# Patient Record
Sex: Female | Born: 1980 | Race: White | Hispanic: No | Marital: Married | Smoking: Never smoker
Health system: Southern US, Community
[De-identification: ages and names within clinical notes are randomized; demographics above are authoritative.]

## PROBLEM LIST (undated history)

## (undated) DIAGNOSIS — K219 Gastro-esophageal reflux disease without esophagitis: Secondary | ICD-10-CM

## (undated) DIAGNOSIS — Z9889 Other specified postprocedural states: Secondary | ICD-10-CM

## (undated) DIAGNOSIS — R112 Nausea with vomiting, unspecified: Secondary | ICD-10-CM

## (undated) DIAGNOSIS — IMO0001 Reserved for inherently not codable concepts without codable children: Secondary | ICD-10-CM

## (undated) DIAGNOSIS — K589 Irritable bowel syndrome without diarrhea: Secondary | ICD-10-CM

## (undated) DIAGNOSIS — I10 Essential (primary) hypertension: Secondary | ICD-10-CM

## (undated) HISTORY — DX: Gastro-esophageal reflux disease without esophagitis: K21.9

## (undated) HISTORY — DX: Reserved for inherently not codable concepts without codable children: IMO0001

## (undated) HISTORY — DX: Irritable bowel syndrome without diarrhea: K58.9

---
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
## 2000-05-28 ENCOUNTER — Other Ambulatory Visit: Admission: RE | Admit: 2000-05-28 | Discharge: 2000-05-28 | Payer: Self-pay | Admitting: *Deleted

## 2001-05-31 ENCOUNTER — Other Ambulatory Visit: Admission: RE | Admit: 2001-05-31 | Discharge: 2001-05-31 | Payer: Self-pay | Admitting: *Deleted

## 2002-10-14 ENCOUNTER — Other Ambulatory Visit: Admission: RE | Admit: 2002-10-14 | Discharge: 2002-10-14 | Payer: Self-pay | Admitting: Obstetrics and Gynecology

## 2003-03-23 ENCOUNTER — Other Ambulatory Visit: Admission: RE | Admit: 2003-03-23 | Discharge: 2003-03-23 | Payer: Self-pay | Admitting: Obstetrics and Gynecology

## 2005-03-06 ENCOUNTER — Ambulatory Visit: Payer: Self-pay | Admitting: Internal Medicine

## 2011-01-27 LAB — RUBELLA ANTIBODY, IGM: Rubella: IMMUNE

## 2011-01-27 LAB — HEPATITIS B SURFACE ANTIGEN: Hepatitis B Surface Ag: NEGATIVE

## 2011-01-27 LAB — GC/CHLAMYDIA PROBE AMP, GENITAL: Chlamydia: NEGATIVE

## 2011-01-27 LAB — ABO/RH: RH Type: POSITIVE

## 2011-01-27 LAB — ANTIBODY SCREEN: Antibody Screen: NEGATIVE

## 2011-07-21 LAB — STREP B DNA PROBE: GBS: POSITIVE

## 2011-08-17 ENCOUNTER — Inpatient Hospital Stay (HOSPITAL_COMMUNITY): Admission: AD | Admit: 2011-08-17 | Payer: Self-pay | Source: Ambulatory Visit | Admitting: Obstetrics and Gynecology

## 2011-08-20 ENCOUNTER — Telehealth (HOSPITAL_COMMUNITY): Payer: Self-pay | Admitting: *Deleted

## 2011-08-20 ENCOUNTER — Encounter (HOSPITAL_COMMUNITY): Payer: Self-pay | Admitting: *Deleted

## 2011-08-20 NOTE — Telephone Encounter (Signed)
Preadmission screen  

## 2011-08-21 ENCOUNTER — Encounter (HOSPITAL_COMMUNITY): Payer: Self-pay | Admitting: *Deleted

## 2011-08-22 ENCOUNTER — Inpatient Hospital Stay (HOSPITAL_COMMUNITY)
Admission: RE | Admit: 2011-08-22 | Discharge: 2011-08-26 | DRG: 371 | Disposition: A | Discharge status: Home / Self Care | Payer: BC Managed Care – PPO | Source: Ambulatory Visit | Attending: Obstetrics and Gynecology | Admitting: Obstetrics and Gynecology

## 2011-08-22 ENCOUNTER — Inpatient Hospital Stay (HOSPITAL_COMMUNITY): Payer: BC Managed Care – PPO | Admitting: Anesthesiology

## 2011-08-22 ENCOUNTER — Encounter (HOSPITAL_COMMUNITY): Payer: Self-pay | Admitting: Anesthesiology

## 2011-08-22 ENCOUNTER — Encounter (HOSPITAL_COMMUNITY): Payer: Self-pay

## 2011-08-22 DIAGNOSIS — O9279 Other disorders of lactation: Secondary | ICD-10-CM | POA: Diagnosis not present

## 2011-08-22 DIAGNOSIS — O480 Post-term pregnancy: Principal | ICD-10-CM | POA: Diagnosis present

## 2011-08-22 DIAGNOSIS — Z22330 Carrier of Group B streptococcus: Secondary | ICD-10-CM

## 2011-08-22 DIAGNOSIS — O99892 Other specified diseases and conditions complicating childbirth: Secondary | ICD-10-CM | POA: Diagnosis present

## 2011-08-22 DIAGNOSIS — O324XX0 Maternal care for high head at term, not applicable or unspecified: Secondary | ICD-10-CM | POA: Diagnosis present

## 2011-08-22 DIAGNOSIS — O328XX0 Maternal care for other malpresentation of fetus, not applicable or unspecified: Secondary | ICD-10-CM | POA: Diagnosis present

## 2011-08-22 LAB — CBC
HCT: 36.6 % (ref 36.0–46.0)
Hemoglobin: 12.4 g/dL (ref 12.0–15.0)
MCH: 32.8 pg (ref 26.0–34.0)
MCHC: 33.9 g/dL (ref 30.0–36.0)
RBC: 3.78 MIL/uL — ABNORMAL LOW (ref 3.87–5.11)

## 2011-08-22 LAB — RPR: RPR Ser Ql: NONREACTIVE

## 2011-08-22 MED ORDER — CITRIC ACID-SODIUM CITRATE 334-500 MG/5ML PO SOLN
30.0000 mL | ORAL | Status: DC | PRN
  Administered 2011-08-23: 30 mL via ORAL
  Filled 2011-08-22: qty 15

## 2011-08-22 MED ORDER — FENTANYL 2.5 MCG/ML BUPIVACAINE 1/10 % EPIDURAL INFUSION (WH - ANES)
INTRAMUSCULAR | Status: DC | PRN
  Administered 2011-08-22: 13 mL/h via EPIDURAL

## 2011-08-22 MED ORDER — LIDOCAINE HCL (PF) 1 % IJ SOLN
30.0000 mL | INTRAMUSCULAR | Status: DC | PRN
  Filled 2011-08-22 (×2): qty 30

## 2011-08-22 MED ORDER — OXYTOCIN 20 UNITS IN LACTATED RINGERS INFUSION - SIMPLE: 125.0000 mL/h | Freq: Once | INTRAVENOUS | Status: DC

## 2011-08-22 MED ORDER — PHENYLEPHRINE 40 MCG/ML (10ML) SYRINGE FOR IV PUSH (FOR BLOOD PRESSURE SUPPORT)
80.0000 ug | PREFILLED_SYRINGE | INTRAVENOUS | Status: DC | PRN
  Filled 2011-08-22 (×2): qty 5

## 2011-08-22 MED ORDER — LACTATED RINGERS IV SOLN
INTRAVENOUS | Status: DC
  Administered 2011-08-22 – 2011-08-23 (×6): via INTRAVENOUS

## 2011-08-22 MED ORDER — TERBUTALINE SULFATE 1 MG/ML IJ SOLN: 0.2500 mg | Freq: Once | INTRAMUSCULAR | Status: AC | PRN

## 2011-08-22 MED ORDER — BUTORPHANOL TARTRATE 2 MG/ML IJ SOLN: 1.0000 mg | INTRAMUSCULAR | Status: DC | PRN

## 2011-08-22 MED ORDER — OXYTOCIN 20 UNITS IN LACTATED RINGERS INFUSION - SIMPLE
1.0000 m[IU]/min | INTRAVENOUS | Status: DC
  Administered 2011-08-22: 2 m[IU]/min via INTRAVENOUS
  Filled 2011-08-22: qty 1000

## 2011-08-22 MED ORDER — LIDOCAINE HCL 1.5 % IJ SOLN
INTRAMUSCULAR | Status: DC | PRN
  Administered 2011-08-22 (×2): 5 mL via EPIDURAL

## 2011-08-22 MED ORDER — FENTANYL 2.5 MCG/ML BUPIVACAINE 1/10 % EPIDURAL INFUSION (WH - ANES)
14.0000 mL/h | INTRAMUSCULAR | Status: DC
  Administered 2011-08-22 (×2): 14 mL/h via EPIDURAL
  Filled 2011-08-22 (×4): qty 60

## 2011-08-22 MED ORDER — IBUPROFEN 600 MG PO TABS: 600.0000 mg | ORAL_TABLET | Freq: Four times a day (QID) | ORAL | Status: DC | PRN

## 2011-08-22 MED ORDER — PENICILLIN G POTASSIUM 5000000 UNITS IJ SOLR
2.5000 10*6.[IU] | INTRAVENOUS | Status: DC
  Administered 2011-08-22 – 2011-08-23 (×4): 2.5 10*6.[IU] via INTRAVENOUS
  Filled 2011-08-22 (×8): qty 2.5

## 2011-08-22 MED ORDER — PENICILLIN G POTASSIUM 5000000 UNITS IJ SOLR
5.0000 10*6.[IU] | Freq: Once | INTRAVENOUS | Status: AC
  Administered 2011-08-22: 5 10*6.[IU] via INTRAVENOUS
  Filled 2011-08-22: qty 5

## 2011-08-22 MED ORDER — PHENYLEPHRINE 40 MCG/ML (10ML) SYRINGE FOR IV PUSH (FOR BLOOD PRESSURE SUPPORT)
80.0000 ug | PREFILLED_SYRINGE | INTRAVENOUS | Status: DC | PRN
  Filled 2011-08-22: qty 5

## 2011-08-22 MED ORDER — FLEET ENEMA 7-19 GM/118ML RE ENEM: 1.0000 | ENEMA | RECTAL | Status: DC | PRN

## 2011-08-22 MED ORDER — EPHEDRINE 5 MG/ML INJ
10.0000 mg | INTRAVENOUS | Status: DC | PRN
  Filled 2011-08-22: qty 4

## 2011-08-22 MED ORDER — LACTATED RINGERS IV SOLN
500.0000 mL | Freq: Once | INTRAVENOUS | Status: AC
  Administered 2011-08-22: 500 mL via INTRAVENOUS

## 2011-08-22 MED ORDER — ONDANSETRON HCL 4 MG/2ML IJ SOLN: 4.0000 mg | Freq: Four times a day (QID) | INTRAMUSCULAR | Status: DC | PRN

## 2011-08-22 MED ORDER — OXYTOCIN BOLUS FROM INFUSION
500.0000 mL | Freq: Once | INTRAVENOUS | Status: DC
  Filled 2011-08-22: qty 500

## 2011-08-22 MED ORDER — LACTATED RINGERS IV SOLN
500.0000 mL | INTRAVENOUS | Status: DC | PRN
  Administered 2011-08-22: 500 mL via INTRAVENOUS

## 2011-08-22 MED ORDER — ACETAMINOPHEN 325 MG PO TABS: 650.0000 mg | ORAL_TABLET | ORAL | Status: DC | PRN

## 2011-08-22 MED ORDER — DIPHENHYDRAMINE HCL 50 MG/ML IJ SOLN: 12.5000 mg | INTRAMUSCULAR | Status: DC | PRN

## 2011-08-22 MED ORDER — OXYCODONE-ACETAMINOPHEN 5-325 MG PO TABS: 2.0000 | ORAL_TABLET | ORAL | Status: DC | PRN

## 2011-08-22 MED ORDER — EPHEDRINE 5 MG/ML INJ
10.0000 mg | INTRAVENOUS | Status: DC | PRN
  Filled 2011-08-22 (×2): qty 4

## 2011-08-22 NOTE — Progress Notes (Signed)
  Labor PN  Comfortable after epidural   Filed Vitals:   08/22/11 1601 08/22/11 1629 08/22/11 1631 08/22/11 1701  BP: 117/83  118/73 113/69  Pulse: 81  82 76  Temp:  97.8 F (36.6 C)    TempSrc:  Oral    Resp: 18 18  18   Height:      Weight:      SpO2:       Dilation: 3 Effacement (%): 90 Cervical Position: Middle Station: 0 Presentation: Vertex Exam by:: dr Tenny Craw FHT 140 +accels, no decels, reactive Toco: q2-3  A/P Cont pit fwb reassuring

## 2011-08-22 NOTE — Anesthesia Procedure Notes (Signed)
Epidural Patient location during procedure: OB Start time: 08/22/2011 2:53 PM End time: 08/22/2011 2:58 PM Reason for block: procedure for pain  Staffing Anesthesiologist: Sandrea Hughs Performed by: anesthesiologist   Preanesthetic Checklist Completed: patient identified, site marked, surgical consent, pre-op evaluation, timeout performed, IV checked, risks and benefits discussed and monitors and equipment checked  Epidural Patient position: sitting Prep: site prepped and draped and DuraPrep Patient monitoring: continuous pulse ox and blood pressure Approach: midline Injection technique: LOR air  Needle:  Needle type: Tuohy  Needle gauge: 17 G Needle length: 9 cm Needle insertion depth: 5 cm cm Catheter type: closed end flexible Catheter size: 19 Gauge Catheter at skin depth: 10 cm Test dose: negative and 1.5% lidocaine  Assessment Sensory level: T8 Events: blood not aspirated, injection not painful, no injection resistance, negative IV test and no paresthesia

## 2011-08-22 NOTE — Anesthesia Preprocedure Evaluation (Signed)
Anesthesia Evaluation  Patient identified by MRN, date of birth, ID band Patient awake  General Assessment Comment  Reviewed: Allergy & Precautions, H&P , NPO status , Patient's Chart, lab work & pertinent test results  Airway Mallampati: I TM Distance: >3 FB Neck ROM: full    Dental No notable dental hx.    Pulmonary    Pulmonary exam normal       Cardiovascular     Neuro/Psych Negative Neurological ROS  Negative Psych ROS   GI/Hepatic negative GI ROS Neg liver ROS    Endo/Other  Negative Endocrine ROS  Renal/GU negative Renal ROS  Genitourinary negative   Musculoskeletal negative musculoskeletal ROS (+)   Abdominal Normal abdominal exam  (+)   Peds negative pediatric ROS (+)  Hematology negative hematology ROS (+)   Anesthesia Other Findings   Reproductive/Obstetrics (+) Pregnancy                           Anesthesia Physical Anesthesia Plan  ASA: II  Anesthesia Plan: Epidural   Post-op Pain Management:    Induction:   Airway Management Planned:   Additional Equipment:   Intra-op Plan:   Post-operative Plan:   Informed Consent: I have reviewed the patients History and Physical, chart, labs and discussed the procedure including the risks, benefits and alternatives for the proposed anesthesia with the patient or authorized representative who has indicated his/her understanding and acceptance.     Plan Discussed with:   Anesthesia Plan Comments:         Anesthesia Quick Evaluation  

## 2011-08-22 NOTE — H&P (Signed)
Anna Brady is a 30 y.o. female G1P0 presenting @ 40+5 for postdates induction of labor Pregnancy has been uncomplicated History OB History    Grav Para Term Preterm Abortions TAB SAB Ect Mult Living   1 0 0 0 0 0 0 0 0 0      Past Medical History  Diagnosis Date  . IBS (irritable bowel syndrome)   . Reflux   . IBS (irritable bowel syndrome)    History reviewed. No pertinent past surgical history. Family History: family history includes Cancer in her maternal grandmother, mother, paternal aunt, and paternal grandfather; Diabetes in her father, maternal grandfather, maternal grandmother, maternal uncle, and paternal aunt; Heart disease in her father, maternal grandfather, and paternal uncle; and Hypertension in her cousin, maternal aunt, maternal grandfather, maternal grandmother, maternal uncle, and mother.  There is no history of Anesthesia problems, and Hypotension, and Malignant hyperthermia, and Pseudochol deficiency, . Social History:  reports that she has never smoked. She has never used smokeless tobacco. She reports that she does not drink alcohol or use illicit drugs.  ROS    Blood pressure 122/87, pulse 91, resp. rate 20, height 5\' 7"  (1.702 m), weight 69.854 kg (154 lb), last menstrual period 11/10/2010. Exam Physical Exam  AOx3, NAD Gravid, soft, nt cvx 3/80/-2 Fht: 140-150 + accels, reactive, no decels CVX: 3/80/-2  Prenatal labs: ABO, Rh: A/Positive/-- (04/02 0000) Antibody: Negative (04/02 0000) Rubella: Immune (04/02 0000) RPR: Nonreactive (04/02 0000)  HBsAg: Negative (04/02 0000)  HIV: Non-reactive (04/02 0000)  GBS: Positive (09/24 0000)   Assessment/Plan: Admit PCN for GBS Pitocin for augmentation Epidural on request AROM after 2nd dose PCN  [name] H. 08/22/2011, 9:27 AM

## 2011-08-22 NOTE — Progress Notes (Signed)
  Labor PN  Uncomfortable with contractions Filed Vitals:   08/22/11 1234 08/22/11 1304 08/22/11 1333 08/22/11 1405  BP: 111/65 112/68 113/70 114/70  Pulse: 85 85 88 99  Temp:      TempSrc:      Resp:  18 17 18   Height:      Weight:       fht 140-150 reactive with aceels no decels cvx unchanged from previous exam toco Q1-2  A/P Place epidural arom after epidural fwb reassuring

## 2011-08-23 ENCOUNTER — Encounter (HOSPITAL_COMMUNITY): Payer: Self-pay

## 2011-08-23 ENCOUNTER — Encounter (HOSPITAL_COMMUNITY): Payer: Self-pay | Admitting: Anesthesiology

## 2011-08-23 ENCOUNTER — Encounter (HOSPITAL_COMMUNITY)
Admission: RE | Disposition: A | Discharge status: Home / Self Care | Payer: Self-pay | Source: Ambulatory Visit | Attending: Obstetrics and Gynecology

## 2011-08-23 SURGERY — Surgical Case
Anesthesia: Epidural | Site: Abdomen | Wound class: Clean Contaminated

## 2011-08-23 MED ORDER — SODIUM BICARBONATE 8.4 % IV SOLN
INTRAVENOUS | Status: DC | PRN
  Administered 2011-08-23: 5 mL via EPIDURAL

## 2011-08-23 MED ORDER — LANOLIN HYDROUS EX OINT: 1.0000 "application " | TOPICAL_OINTMENT | CUTANEOUS | Status: DC | PRN

## 2011-08-23 MED ORDER — IBUPROFEN 600 MG PO TABS
600.0000 mg | ORAL_TABLET | Freq: Four times a day (QID) | ORAL | Status: DC | PRN
  Filled 2011-08-23 (×8): qty 1

## 2011-08-23 MED ORDER — DIPHENHYDRAMINE HCL 50 MG/ML IJ SOLN: 12.5000 mg | INTRAMUSCULAR | Status: DC | PRN

## 2011-08-23 MED ORDER — CEFAZOLIN SODIUM 1-5 GM-% IV SOLN
INTRAVENOUS | Status: AC
  Filled 2011-08-23: qty 50

## 2011-08-23 MED ORDER — ZOLPIDEM TARTRATE 5 MG PO TABS: 5.0000 mg | ORAL_TABLET | Freq: Every evening | ORAL | Status: DC | PRN

## 2011-08-23 MED ORDER — MEPERIDINE HCL 25 MG/ML IJ SOLN
INTRAMUSCULAR | Status: AC
  Filled 2011-08-23: qty 1

## 2011-08-23 MED ORDER — PHENYLEPHRINE HCL 10 MG/ML IJ SOLN
INTRAMUSCULAR | Status: DC | PRN
  Administered 2011-08-23: 80 ug via INTRAVENOUS
  Administered 2011-08-23: 120 ug via INTRAVENOUS

## 2011-08-23 MED ORDER — DIPHENHYDRAMINE HCL 25 MG PO CAPS: 25.0000 mg | ORAL_CAPSULE | ORAL | Status: DC | PRN

## 2011-08-23 MED ORDER — MEPERIDINE HCL 25 MG/ML IJ SOLN
INTRAMUSCULAR | Status: DC | PRN
  Administered 2011-08-23: 7 mg via INTRAVENOUS
  Administered 2011-08-23 (×3): 6 mg via INTRAVENOUS

## 2011-08-23 MED ORDER — DIPHENHYDRAMINE HCL 25 MG PO CAPS: 25.0000 mg | ORAL_CAPSULE | Freq: Four times a day (QID) | ORAL | Status: DC | PRN

## 2011-08-23 MED ORDER — DIPHENHYDRAMINE HCL 50 MG/ML IJ SOLN: 25.0000 mg | INTRAMUSCULAR | Status: DC | PRN

## 2011-08-23 MED ORDER — LIDOCAINE-EPINEPHRINE (PF) 2 %-1:200000 IJ SOLN
INTRAMUSCULAR | Status: AC
  Filled 2011-08-23: qty 20

## 2011-08-23 MED ORDER — ONDANSETRON HCL 4 MG/2ML IJ SOLN: 4.0000 mg | INTRAMUSCULAR | Status: DC | PRN

## 2011-08-23 MED ORDER — KETOROLAC TROMETHAMINE 30 MG/ML IJ SOLN
30.0000 mg | Freq: Four times a day (QID) | INTRAMUSCULAR | Status: AC | PRN
  Administered 2011-08-23: 30 mg via INTRAVENOUS
  Filled 2011-08-23: qty 1

## 2011-08-23 MED ORDER — OXYTOCIN 20 UNITS IN LACTATED RINGERS INFUSION - SIMPLE: 125.0000 mL/h | INTRAVENOUS | Status: AC

## 2011-08-23 MED ORDER — ONDANSETRON HCL 4 MG/2ML IJ SOLN
INTRAMUSCULAR | Status: AC
  Filled 2011-08-23: qty 2

## 2011-08-23 MED ORDER — ONDANSETRON HCL 4 MG/2ML IJ SOLN: 4.0000 mg | Freq: Three times a day (TID) | INTRAMUSCULAR | Status: DC | PRN

## 2011-08-23 MED ORDER — NALBUPHINE HCL 10 MG/ML IJ SOLN
5.0000 mg | INTRAMUSCULAR | Status: DC | PRN
  Filled 2011-08-23: qty 1

## 2011-08-23 MED ORDER — KETOROLAC TROMETHAMINE 60 MG/2ML IM SOLN
INTRAMUSCULAR | Status: AC
  Administered 2011-08-23: 60 mg via INTRAMUSCULAR
  Filled 2011-08-23: qty 2

## 2011-08-23 MED ORDER — ONDANSETRON HCL 4 MG PO TABS: 4.0000 mg | ORAL_TABLET | ORAL | Status: DC | PRN

## 2011-08-23 MED ORDER — NALOXONE HCL 0.4 MG/ML IJ SOLN: 0.4000 mg | INTRAMUSCULAR | Status: DC | PRN

## 2011-08-23 MED ORDER — CEFAZOLIN SODIUM 1-5 GM-% IV SOLN
INTRAVENOUS | Status: DC | PRN
  Administered 2011-08-23: 1 g via INTRAVENOUS

## 2011-08-23 MED ORDER — OXYTOCIN 20 UNITS IN LACTATED RINGERS INFUSION - SIMPLE
INTRAVENOUS | Status: DC | PRN
  Administered 2011-08-23 (×2): 20 [IU] via INTRAVENOUS

## 2011-08-23 MED ORDER — PANTOPRAZOLE SODIUM 40 MG PO TBEC
40.0000 mg | DELAYED_RELEASE_TABLET | Freq: Every day | ORAL | Status: DC
  Administered 2011-08-23 – 2011-08-26 (×4): 40 mg via ORAL
  Filled 2011-08-23 (×5): qty 1

## 2011-08-23 MED ORDER — OXYTOCIN 10 UNIT/ML IJ SOLN
INTRAMUSCULAR | Status: AC
  Filled 2011-08-23: qty 4

## 2011-08-23 MED ORDER — MENTHOL 3 MG MT LOZG: 1.0000 | LOZENGE | OROMUCOSAL | Status: DC | PRN

## 2011-08-23 MED ORDER — MORPHINE SULFATE (PF) 0.5 MG/ML IJ SOLN
INTRAMUSCULAR | Status: DC | PRN
  Administered 2011-08-23: 1 mg via INTRAVENOUS

## 2011-08-23 MED ORDER — SODIUM BICARBONATE 8.4 % IV SOLN
INTRAVENOUS | Status: AC
  Filled 2011-08-23: qty 50

## 2011-08-23 MED ORDER — LACTATED RINGERS IV SOLN
INTRAVENOUS | Status: DC
  Administered 2011-08-23: 12:00:00 via INTRAVENOUS

## 2011-08-23 MED ORDER — TETANUS-DIPHTH-ACELL PERTUSSIS 5-2.5-18.5 LF-MCG/0.5 IM SUSP
0.5000 mL | Freq: Once | INTRAMUSCULAR | Status: AC
Start: 2011-08-24 — End: 2011-08-24
  Administered 2011-08-24: 0.5 mL via INTRAMUSCULAR

## 2011-08-23 MED ORDER — SODIUM CHLORIDE 0.9 % IV SOLN
1.0000 ug/kg/h | INTRAVENOUS | Status: DC | PRN
  Filled 2011-08-23: qty 2.5

## 2011-08-23 MED ORDER — MORPHINE SULFATE 0.5 MG/ML IJ SOLN
INTRAMUSCULAR | Status: AC
  Filled 2011-08-23: qty 10

## 2011-08-23 MED ORDER — ONDANSETRON HCL 4 MG/2ML IJ SOLN
INTRAMUSCULAR | Status: DC | PRN
  Administered 2011-08-23: 4 mg via INTRAVENOUS

## 2011-08-23 MED ORDER — METHYLERGONOVINE MALEATE 0.2 MG/ML IJ SOLN: 0.2000 mg | INTRAMUSCULAR | Status: DC | PRN

## 2011-08-23 MED ORDER — SODIUM CHLORIDE 0.9 % IR SOLN
Status: DC | PRN
  Administered 2011-08-23: 1

## 2011-08-23 MED ORDER — SODIUM CHLORIDE 0.9 % IJ SOLN: 3.0000 mL | INTRAMUSCULAR | Status: DC | PRN

## 2011-08-23 MED ORDER — KETOROLAC TROMETHAMINE 30 MG/ML IJ SOLN: 30.0000 mg | Freq: Four times a day (QID) | INTRAMUSCULAR | Status: AC | PRN

## 2011-08-23 MED ORDER — MORPHINE SULFATE (PF) 0.5 MG/ML IJ SOLN
INTRAMUSCULAR | Status: DC | PRN
  Administered 2011-08-23: 4 mg via EPIDURAL

## 2011-08-23 MED ORDER — SCOPOLAMINE 1 MG/3DAYS TD PT72
1.0000 | MEDICATED_PATCH | Freq: Once | TRANSDERMAL | Status: DC
  Filled 2011-08-23: qty 1

## 2011-08-23 MED ORDER — EPHEDRINE 5 MG/ML INJ
INTRAVENOUS | Status: AC
  Filled 2011-08-23: qty 10

## 2011-08-23 MED ORDER — IBUPROFEN 600 MG PO TABS
600.0000 mg | ORAL_TABLET | Freq: Four times a day (QID) | ORAL | Status: DC
  Administered 2011-08-23 – 2011-08-26 (×12): 600 mg via ORAL
  Filled 2011-08-23 (×4): qty 1

## 2011-08-23 MED ORDER — SENNOSIDES-DOCUSATE SODIUM 8.6-50 MG PO TABS
2.0000 | ORAL_TABLET | Freq: Every day | ORAL | Status: DC
  Administered 2011-08-23 – 2011-08-25 (×3): 2 via ORAL

## 2011-08-23 MED ORDER — LACTATED RINGERS IV SOLN
INTRAVENOUS | Status: DC | PRN
  Administered 2011-08-23: 04:00:00 via INTRAVENOUS

## 2011-08-23 MED ORDER — METHYLERGONOVINE MALEATE 0.2 MG PO TABS: 0.2000 mg | ORAL_TABLET | ORAL | Status: DC | PRN

## 2011-08-23 MED ORDER — PRENATAL PLUS 27-1 MG PO TABS
1.0000 | ORAL_TABLET | Freq: Every day | ORAL | Status: DC
  Administered 2011-08-23 – 2011-08-26 (×4): 1 via ORAL
  Filled 2011-08-23 (×4): qty 1

## 2011-08-23 MED ORDER — WITCH HAZEL-GLYCERIN EX PADS: 1.0000 "application " | MEDICATED_PAD | CUTANEOUS | Status: DC | PRN

## 2011-08-23 MED ORDER — DIBUCAINE 1 % RE OINT: 1.0000 "application " | TOPICAL_OINTMENT | RECTAL | Status: DC | PRN

## 2011-08-23 MED ORDER — MEPERIDINE HCL 25 MG/ML IJ SOLN: 6.2500 mg | INTRAMUSCULAR | Status: DC | PRN

## 2011-08-23 MED ORDER — SIMETHICONE 80 MG PO CHEW: 80.0000 mg | CHEWABLE_TABLET | ORAL | Status: DC | PRN

## 2011-08-23 MED ORDER — PHENYLEPHRINE 40 MCG/ML (10ML) SYRINGE FOR IV PUSH (FOR BLOOD PRESSURE SUPPORT)
PREFILLED_SYRINGE | INTRAVENOUS | Status: AC
  Filled 2011-08-23: qty 5

## 2011-08-23 MED ORDER — EPHEDRINE SULFATE 50 MG/ML IJ SOLN
INTRAMUSCULAR | Status: DC | PRN
  Administered 2011-08-23: 10 mg via INTRAVENOUS

## 2011-08-23 MED ORDER — OXYCODONE-ACETAMINOPHEN 5-325 MG PO TABS
1.0000 | ORAL_TABLET | ORAL | Status: DC | PRN
  Administered 2011-08-23 – 2011-08-26 (×12): 1 via ORAL
  Filled 2011-08-23 (×12): qty 1

## 2011-08-23 MED ORDER — KETOROLAC TROMETHAMINE 60 MG/2ML IM SOLN
60.0000 mg | Freq: Once | INTRAMUSCULAR | Status: AC | PRN
  Administered 2011-08-23: 60 mg via INTRAMUSCULAR

## 2011-08-23 MED ORDER — SIMETHICONE 80 MG PO CHEW
80.0000 mg | CHEWABLE_TABLET | Freq: Three times a day (TID) | ORAL | Status: DC
  Administered 2011-08-23 – 2011-08-26 (×8): 80 mg via ORAL

## 2011-08-23 MED ORDER — METOCLOPRAMIDE HCL 5 MG/ML IJ SOLN: 10.0000 mg | Freq: Three times a day (TID) | INTRAMUSCULAR | Status: DC | PRN

## 2011-08-23 SURGICAL SUPPLY — 27 items
CLOTH BEACON ORANGE TIMEOUT ST (SAFETY) ×2 IMPLANT
DERMABOND ADVANCED (GAUZE/BANDAGES/DRESSINGS) ×1
DERMABOND ADVANCED .7 DNX12 (GAUZE/BANDAGES/DRESSINGS) ×1 IMPLANT
DRESSING TELFA 8X3 (GAUZE/BANDAGES/DRESSINGS) ×2 IMPLANT
ELECT REM PT RETURN 9FT ADLT (ELECTROSURGICAL) ×2
ELECTRODE REM PT RTRN 9FT ADLT (ELECTROSURGICAL) ×1 IMPLANT
EXTRACTOR VACUUM M CUP 4 TUBE (SUCTIONS) IMPLANT
GAUZE SPONGE 4X4 12PLY STRL LF (GAUZE/BANDAGES/DRESSINGS) ×4 IMPLANT
GLOVE BIO SURGEON STRL SZ7 (GLOVE) ×4 IMPLANT
GOWN PREVENTION PLUS LG XLONG (DISPOSABLE) ×4 IMPLANT
KIT ABG SYR 3ML LUER SLIP (SYRINGE) ×2 IMPLANT
NEEDLE HYPO 25X5/8 SAFETYGLIDE (NEEDLE) ×2 IMPLANT
NS IRRIG 1000ML POUR BTL (IV SOLUTION) ×2 IMPLANT
PACK C SECTION WH (CUSTOM PROCEDURE TRAY) ×2 IMPLANT
PAD ABD 7.5X8 STRL (GAUZE/BANDAGES/DRESSINGS) ×2 IMPLANT
RTRCTR C-SECT PINK 25CM LRG (MISCELLANEOUS) ×2 IMPLANT
RTRCTR C-SECT PINK 34CM XLRG (MISCELLANEOUS) IMPLANT
SLEEVE SCD COMPRESS KNEE MED (MISCELLANEOUS) ×2 IMPLANT
STAPLER VISISTAT 35W (STAPLE) IMPLANT
SUT CHROMIC 1 CTX 36 (SUTURE) ×10 IMPLANT
SUT CHROMIC 2 0 CT 1 (SUTURE) ×2 IMPLANT
SUT PDS AB 0 CTX 60 (SUTURE) ×2 IMPLANT
SUT VIC AB 2-0 CT1 27 (SUTURE) ×1
SUT VIC AB 2-0 CT1 TAPERPNT 27 (SUTURE) ×1 IMPLANT
TOWEL OR 17X24 6PK STRL BLUE (TOWEL DISPOSABLE) ×4 IMPLANT
TRAY FOLEY CATH 14FR (SET/KITS/TRAYS/PACK) IMPLANT
WATER STERILE IRR 1000ML POUR (IV SOLUTION) ×2 IMPLANT

## 2011-08-23 NOTE — Anesthesia Postprocedure Evaluation (Signed)
  Anesthesia Post-op Note  Patient: Anna Brady  Procedure(s) Performed:  CESAREAN SECTION - primary of baby  girl at 0302  APGAR 4/9, cord ph 7.24  Patient Location: PACU  Anesthesia Type: Regional  Level of Consciousness: awake  Airway and Oxygen Therapy: Patient Spontanous Breathing  Post-op Pain: none  Post-op Assessment: Post-op Vital signs reviewed  Post-op Vital Signs: Reviewed and stable  Complications: No apparent anesthesia complications

## 2011-08-23 NOTE — Op Note (Signed)
Pre-Operative Diagnosis: 1) 40+6 week intrauterine Pregnancy 2) Failure to descend Postoperative diagnoses: Same Procedure: Primary low transverse cesarean section converted to a inverted T uterine incision with double footling breech delivery Surgeon: Dr. Waynard Reeds Asst.: None Operative findings: Female infant in the vertex OP presentation with a hyperextended neck.  Uterine incision was extended to a inverted T and the baby was delivered in the double footling breech presentation.  Apgars 4 and 9. Birth weight 8#11 Specimen: Placenta EBL: Total I/O In: 3000 [I.V.:3000] Out: 1400 [Urine:600; Blood:800]  Procedure: Anna Brady is a 30 year old gravida 1 para 0 now para 1 Caucasian female who presented for postdates induction of labor at 40 weeks and 5 days. She progressed normally in labor and made it to complete and pushing and pushed for approximately 2 hours. She develop maternal exhaustion and the fetal vertex was not distended in the pelvis despite adequate expulsive effort by the mother. Given failure to to descend the decision was made to proceed with primary cesarean section. Risks benefits and alternatives of the procedure were discussed with the patient on the appropriate informed consent patient was brought to the operating room where epidural anesthesia was confirmed to be adequate. She received a gram of Ancef prior to skin incision. She was prepped and are in the normal sterile fashion and a scalpel was used to make a Pfannenstiel skin incision which was carried to the underlying layers of soft tissue the fascia. The fascia was incised in the midline and the fascial incision was extended laterally with Mayo scissors. The superior aspect of the fascial incision was grasped with Coker clamps x2 tented up, the underlying rectus muscles were dissected off sharply with the electrocautery unit. Procedure is repeated on the inferior aspect of the fascial incision. The rectus muscles were separated  in the midline, and the abdominal peritoneum was identified, tented up, entered sharply with the Metzenbaums, and the incision was extended superiorly inferiorly with good visualization of the bladder. The Alexis retractor was then deployed. The vesicouterine peritoneum was identified, tented up, entered sharply and the incision was extended laterally and the bladder flap was created. A scalpel was then used to make a low transverse incision on the uterus which was extended laterally with blunt dissection the uterus was explored the fetal vertex was identified deep in the pelvis and appear to be occiput posterior with a hyperextended neck. Multiple attempts were made to dislodge the vertex from the deep pelvis however at delivery of the vertex could not be achieved in this fashion and so the decision was made to attempt to deliver the infant in the double foot breech presentation. The upper uterus was explored and the feet were identified grasped and brought down through the uterine incision. The infant was delivered up to the level of the shoulder blades. The arms were swept across the chest and the head was then able to be elevated out of the pelvis. . Cord was clamped and cut and the infant was passed immediately to the waiting neonatologist. Placenta then delivered spontaneously the uterus was exteriorized cleared of all clot and debris. At this time it was apparent that the uterine incision had been been T'd. The T Portion of the incision was repaired with #1 chromic in running locked. A low transverse portion of the incision was then repaired also in running locked fashion. A second imbricating layer of the low-transverse portion of the incision was performed. As well as a second retaining layer of the key portion  of the incision. Ovaries and tubes were normal. Uterus was returned to abdominal cavity and the abdominal cavity was cleared of all clots and debris. The abdominal peritoneum was reapproximated with  2-0 Vicryl. The rectus muscles were reapproximated with 2-0 chromic. The fascia was closed with a looped PDS. The skin was closed with 3-0 Vicryl in a subcuticular fashion. Dermabond was placed over the incision. All sponge and needle counts were correct x2. The patient tolerated the procedure well was brought to the recovery was stable condition following the procedure

## 2011-08-23 NOTE — Progress Notes (Signed)
Subjective: Postpartum Day 0: Cesarean Delivery Patient reports incisional pain and tolerating PO.    Objective: Vital signs in last 24 hours: Temp:  [97.6 F (36.4 C)-99.4 F (37.4 C)] 98.4 F (36.9 C) (10/27 1400) Pulse Rate:  [68-103] 86  (10/27 1400) Resp:  [16-24] 18  (10/27 1400) BP: (97-141)/(54-90) 111/78 mmHg (10/27 1400) SpO2:  [92 %-99 %] 96 % (10/27 1400)  Physical Exam:  General: alert, cooperative, appears stated age and no distress Lochia: appropriate Uterine Fundus: firm Incision: healing well DVT Evaluation: No evidence of DVT seen on physical exam.   Basename 08/22/11 0840  HGB 12.4  HCT 36.6    Assessment/Plan: Status post Cesarean section. Doing well postoperatively.  Continue current care.  Makail Watling H. 08/23/2011, 3:00 PM

## 2011-08-23 NOTE — Anesthesia Postprocedure Evaluation (Signed)
  Anesthesia Post-op Note  Patient: Anna Brady  Procedure(s) Performed:  CESAREAN SECTION - primary of baby  girl at 0302  APGAR 4/9, cord ph 7.24  Patient Location: Mother/Baby  Anesthesia Type: Epidural  Level of Consciousness: awake, alert  and oriented  Airway and Oxygen Therapy: Patient Spontanous Breathing  Post-op Pain: mild  Post-op Assessment: Patient's Cardiovascular Status Stable and Respiratory Function Stable  Post-op Vital Signs: stable  Complications: No apparent anesthesia complications

## 2011-08-23 NOTE — OR Nursing (Signed)
Fundal Massage by DLWegner RN

## 2011-08-23 NOTE — Progress Notes (Signed)
Cs

## 2011-08-23 NOTE — Addendum Note (Signed)
Addendum  created 08/23/11 1610 by Fanny Dance   Modules edited:Notes Section

## 2011-08-23 NOTE — Brief Op Note (Signed)
08/22/2011 - 08/23/2011  4:16 AM  PATIENT:  Vanessa Kick  30 y.o. female  PRE-OPERATIVE DIAGNOSIS:  arrest of descent  POST-OPERATIVE DIAGNOSIS:  arrest of descent  PROCEDURE:  Procedure(s): CESAREAN SECTION  SURGEON:  Surgeon(s): Flay Ghosh H. Ever Halberg  PHYSICIAN ASSISTANT: none  ASSISTANTS: none   ANESTHESIA:   epidural  EBL:  Total I/O In: 3000 [I.V.:3000] Out: 1400 [Urine:600; Blood:800]  BLOOD ADMINISTERED:none  DRAINS: Urinary Catheter (Foley)   LOCAL MEDICATIONS USED:  NONE  SPECIMEN:  Source of Specimen:  Placenta  DISPOSITION OF SPECIMEN:  Disposal  COUNTS:  YES  TOURNIQUET:  * No tourniquets in log *  DICTATION: .Dragon Dictation  PLAN OF CARE: Admit to inpatient   PATIENT DISPOSITION:  PACU - hemodynamically stable.   Delay start of Pharmacological VTE agent (>24hrs) due to surgical blood loss or risk of bleeding:  yes

## 2011-08-23 NOTE — Transfer of Care (Signed)
Immediate Anesthesia Transfer of Care Note  Patient: Anna Brady  Procedure(s) Performed:  CESAREAN SECTION - primary of baby  girl at 0302  APGAR 4/9, cord ph 7.24  Patient Location: PACU  Anesthesia Type: Epidural  Level of Consciousness: awake, alert , oriented and patient cooperative  Airway & Oxygen Therapy: Patient Spontanous Breathing  Post-op Assessment: Report given to PACU RN and Post -op Vital signs reviewed and stable  Post vital signs: Reviewed and stable  Complications: No apparent anesthesia complications

## 2011-08-23 NOTE — Progress Notes (Signed)
Tenny Craw, MD, at bedside. Informs patient about a c-section. Patient okay with this plan of care. Consents obtained.

## 2011-08-23 NOTE — Anesthesia Postprocedure Evaluation (Signed)
  Anesthesia Post-op Note  Patient: Anna Brady  Procedure(s) Performed:  CESAREAN SECTION - primary of baby  girl at 0302  APGAR 4/9, cord ph 7.24   Patient is awake, responsive, moving her legs, and has signs of resolution of her numbness. Pain and nausea are reasonably well controlled. Vital signs are stable and clinically acceptable. Oxygen saturation is clinically acceptable. There are no apparent anesthetic complications at this time. Patient is ready for discharge.

## 2011-08-24 LAB — CBC
MCV: 97.5 fL (ref 78.0–100.0)
Platelets: 141 10*3/uL — ABNORMAL LOW (ref 150–400)
RBC: 2.78 MIL/uL — ABNORMAL LOW (ref 3.87–5.11)
WBC: 10 10*3/uL (ref 4.0–10.5)

## 2011-08-24 NOTE — Progress Notes (Signed)
Subjective: Postpartum Day 1: Cesarean Delivery Patient reports incisional pain, tolerating PO and no problems voiding.    Objective: Vital signs in last 24 hours: Temp:  [98 F (36.7 C)-98.4 F (36.9 C)] 98.2 F (36.8 C) (10/28 0540) Pulse Rate:  [74-96] 79  (10/28 0540) Resp:  [18] 18  (10/28 0540) BP: (98-123)/(64-85) 98/64 mmHg (10/28 0540) SpO2:  [96 %-97 %] 97 % (10/28 0204)  Physical Exam:  General: alert, cooperative, appears stated age and no distress Lochia: appropriate Uterine Fundus: firm Incision: healing well DVT Evaluation: No evidence of DVT seen on physical exam.   Basename 08/24/11 0539 08/22/11 0840  HGB 9.2* 12.4  HCT 27.1* 36.6    Assessment/Plan: Status post Cesarean section. Doing well postoperatively.  Continue current care.  Kallin Henk H. 08/24/2011, 1:19 PM

## 2011-08-25 MED ORDER — IBUPROFEN 600 MG PO TABS: 600.0000 mg | ORAL_TABLET | Freq: Four times a day (QID) | ORAL | Status: AC | PRN

## 2011-08-25 MED ORDER — OXYCODONE-ACETAMINOPHEN 5-325 MG PO TABS: 1.0000 | ORAL_TABLET | ORAL | Status: AC | PRN

## 2011-08-25 MED ORDER — FERROUS SULFATE 325 (65 FE) MG PO TABS: 325.0000 mg | ORAL_TABLET | Freq: Every day | ORAL | Status: DC

## 2011-08-25 MED ORDER — DOCUSATE SODIUM 100 MG PO CAPS: 100.0000 mg | ORAL_CAPSULE | Freq: Two times a day (BID) | ORAL | Status: DC | PRN

## 2011-08-25 MED ORDER — FERROUS SULFATE 325 (65 FE) MG PO TABS
325.0000 mg | ORAL_TABLET | Freq: Every day | ORAL | Status: DC
  Administered 2011-08-26: 325 mg via ORAL
  Filled 2011-08-25: qty 1

## 2011-08-25 MED ORDER — DSS 100 MG PO CAPS: 100.0000 mg | ORAL_CAPSULE | Freq: Two times a day (BID) | ORAL | Status: AC | PRN

## 2011-08-25 NOTE — Progress Notes (Signed)
Pt with very engorged breasts, uncomfortable, working on pumping.  Would like to stay additional day.

## 2011-08-25 NOTE — Discharge Summary (Signed)
Obstetric Discharge Summary Reason for Admission: induction of labor Prenatal Procedures: none Intrapartum Procedures: cesarean: low cervical, transverse and "T" of uterine incision Postpartum Procedures: none Complications-Operative and Postpartum: none Hemoglobin  Date Value Range Status  08/24/2011 9.2* 12.0-15.0 (g/dL) Final     DELTA CHECK NOTED     REPEATED TO VERIFY     HCT  Date Value Range Status  08/24/2011 27.1* 36.0-46.0 (%) Final    Discharge Diagnoses: Failed induction  Discharge Information: Date: 08/25/2011 Activity: as tolerated, no heavy lifting, no driving at least 2 wks Diet: routine Medications: PNV, Ibuprofen, Colace, Iron and Percocet Condition: stable Instructions: refer to practice specific booklet Discharge to: home Follow-up Information    Follow up with ROSS,KENDRA H. in 2 weeks.   Contact information:   [address] [place] [address] [phone]          Newborn Data: Live born female  Birth Weight: 8 lb 11.3 oz (3950 g) APGAR: 4, 9  Home with mother.  Anna Brady (MICHELLE) 08/25/2011, 10:16 AM

## 2011-08-26 ENCOUNTER — Encounter (HOSPITAL_COMMUNITY): Payer: Self-pay | Admitting: Obstetrics and Gynecology

## 2014-08-28 ENCOUNTER — Encounter (HOSPITAL_COMMUNITY): Payer: Self-pay | Admitting: Obstetrics and Gynecology

## 2016-03-05 ENCOUNTER — Other Ambulatory Visit: Payer: Self-pay | Admitting: Obstetrics and Gynecology

## 2016-03-05 DIAGNOSIS — R928 Other abnormal and inconclusive findings on diagnostic imaging of breast: Secondary | ICD-10-CM

## 2016-04-02 ENCOUNTER — Ambulatory Visit
Admission: RE | Admit: 2016-04-02 | Discharge: 2016-04-02 | Disposition: A | Discharge status: Home / Self Care | Payer: BC Managed Care – PPO | Source: Ambulatory Visit | Attending: Obstetrics and Gynecology | Admitting: Obstetrics and Gynecology

## 2016-04-02 DIAGNOSIS — R928 Other abnormal and inconclusive findings on diagnostic imaging of breast: Secondary | ICD-10-CM

## 2016-04-04 ENCOUNTER — Other Ambulatory Visit: Payer: Self-pay | Admitting: Obstetrics and Gynecology

## 2016-04-04 DIAGNOSIS — R921 Mammographic calcification found on diagnostic imaging of breast: Secondary | ICD-10-CM

## 2016-05-13 ENCOUNTER — Ambulatory Visit
Admission: RE | Admit: 2016-05-13 | Discharge: 2016-05-13 | Disposition: A | Discharge status: Home / Self Care | Payer: BC Managed Care – PPO | Source: Ambulatory Visit | Attending: Obstetrics and Gynecology | Admitting: Obstetrics and Gynecology

## 2016-05-13 DIAGNOSIS — R921 Mammographic calcification found on diagnostic imaging of breast: Secondary | ICD-10-CM

## 2016-07-03 ENCOUNTER — Encounter (HOSPITAL_COMMUNITY): Payer: Self-pay | Admitting: *Deleted

## 2016-07-16 ENCOUNTER — Other Ambulatory Visit: Payer: Self-pay | Admitting: Obstetrics and Gynecology

## 2016-07-17 NOTE — Patient Instructions (Signed)
Your procedure is scheduled on:07/25/16  Enter through the Main Entrance at :1030 am Pick up desk phone and dial [phone] and inform us of your arrival.  Please call [phone] if you have any problems the morning of surgery.  Remember: Do not eat food  after midnight:Thursday Clear liquids are ok until:8am on Friday   You may brush your teeth the morning of surgery.  Take these meds the morning of surgery with a sip of water:none  DO NOT wear jewelry, eye make-up, lipstick,body lotion, or dark fingernail polish.  (Polished toes are ok) You may wear deodorant.  If you are to be admitted after surgery, leave suitcase in car until your room has been assigned. Patients discharged on the day of surgery will not be allowed to drive home. Wear loose fitting, comfortable clothes for your ride home.

## 2016-07-18 ENCOUNTER — Encounter (HOSPITAL_COMMUNITY): Payer: Self-pay | Admitting: *Deleted

## 2016-07-18 ENCOUNTER — Encounter (HOSPITAL_COMMUNITY)
Admission: RE | Admit: 2016-07-18 | Discharge: 2016-07-18 | Disposition: A | Discharge status: Home / Self Care | Payer: BC Managed Care – PPO | Source: Ambulatory Visit | Attending: Obstetrics and Gynecology | Admitting: Obstetrics and Gynecology

## 2016-07-18 DIAGNOSIS — Z01818 Encounter for other preprocedural examination: Secondary | ICD-10-CM | POA: Insufficient documentation

## 2016-07-18 HISTORY — DX: Gastro-esophageal reflux disease without esophagitis: K21.9

## 2016-07-18 LAB — CBC
HEMATOCRIT: 40.3 % (ref 36.0–46.0)
HEMOGLOBIN: 14.1 g/dL (ref 12.0–15.0)
MCH: 31.7 pg (ref 26.0–34.0)
MCHC: 35 g/dL (ref 30.0–36.0)
MCV: 90.6 fL (ref 78.0–100.0)
Platelets: 271 10*3/uL (ref 150–400)
RBC: 4.45 MIL/uL (ref 3.87–5.11)
RDW: 13.4 % (ref 11.5–15.5)
WBC: 5.3 10*3/uL (ref 4.0–10.5)

## 2016-07-25 ENCOUNTER — Encounter (HOSPITAL_COMMUNITY)
Admission: RE | Disposition: A | Discharge status: Home / Self Care | Payer: Self-pay | Source: Ambulatory Visit | Attending: Obstetrics and Gynecology

## 2016-07-25 ENCOUNTER — Other Ambulatory Visit: Payer: Self-pay | Admitting: Obstetrics and Gynecology

## 2016-07-25 ENCOUNTER — Ambulatory Visit (HOSPITAL_COMMUNITY): Payer: BC Managed Care – PPO | Admitting: Anesthesiology

## 2016-07-25 ENCOUNTER — Encounter (HOSPITAL_COMMUNITY): Payer: Self-pay

## 2016-07-25 ENCOUNTER — Ambulatory Visit (HOSPITAL_COMMUNITY)
Admission: RE | Admit: 2016-07-25 | Discharge: 2016-07-25 | Disposition: A | Discharge status: Home / Self Care | Payer: BC Managed Care – PPO | Source: Ambulatory Visit | Attending: Obstetrics and Gynecology | Admitting: Obstetrics and Gynecology

## 2016-07-25 DIAGNOSIS — K219 Gastro-esophageal reflux disease without esophagitis: Secondary | ICD-10-CM | POA: Insufficient documentation

## 2016-07-25 DIAGNOSIS — Z302 Encounter for sterilization: Secondary | ICD-10-CM | POA: Insufficient documentation

## 2016-07-25 DIAGNOSIS — Z7982 Long term (current) use of aspirin: Secondary | ICD-10-CM | POA: Diagnosis not present

## 2016-07-25 HISTORY — DX: Other specified postprocedural states: Z98.890

## 2016-07-25 HISTORY — PX: LAPAROSCOPIC BILATERAL SALPINGECTOMY: SHX5889

## 2016-07-25 HISTORY — DX: Nausea with vomiting, unspecified: R11.2

## 2016-07-25 LAB — PREGNANCY, URINE: Preg Test, Ur: NEGATIVE

## 2016-07-25 SURGERY — SALPINGECTOMY, BILATERAL, LAPAROSCOPIC
Anesthesia: General | Site: Abdomen | Laterality: Bilateral

## 2016-07-25 MED ORDER — LIDOCAINE HCL (CARDIAC) 20 MG/ML IV SOLN
INTRAVENOUS | Status: DC | PRN
  Administered 2016-07-25: 100 mg via INTRAVENOUS

## 2016-07-25 MED ORDER — SUGAMMADEX SODIUM 200 MG/2ML IV SOLN
INTRAVENOUS | Status: DC | PRN
  Administered 2016-07-25: 120 mg via INTRAVENOUS

## 2016-07-25 MED ORDER — DIPHENHYDRAMINE HCL 50 MG/ML IJ SOLN
12.5000 mg | Freq: Once | INTRAMUSCULAR | Status: AC
  Administered 2016-07-25: 12.5 mg via INTRAVENOUS

## 2016-07-25 MED ORDER — SUGAMMADEX SODIUM 200 MG/2ML IV SOLN
INTRAVENOUS | Status: AC
  Filled 2016-07-25: qty 2

## 2016-07-25 MED ORDER — DEXAMETHASONE SODIUM PHOSPHATE 10 MG/ML IJ SOLN
INTRAMUSCULAR | Status: AC
  Filled 2016-07-25: qty 1

## 2016-07-25 MED ORDER — ROCURONIUM BROMIDE 100 MG/10ML IV SOLN
INTRAVENOUS | Status: DC | PRN
  Administered 2016-07-25: 50 mg via INTRAVENOUS

## 2016-07-25 MED ORDER — BUPIVACAINE HCL (PF) 0.25 % IJ SOLN
INTRAMUSCULAR | Status: AC
  Filled 2016-07-25: qty 30

## 2016-07-25 MED ORDER — HYDROMORPHONE HCL 1 MG/ML IJ SOLN
INTRAMUSCULAR | Status: DC | PRN
  Administered 2016-07-25: 1 mg via INTRAVENOUS

## 2016-07-25 MED ORDER — METOCLOPRAMIDE HCL 5 MG/ML IJ SOLN: 10.0000 mg | Freq: Once | INTRAMUSCULAR | Status: DC | PRN

## 2016-07-25 MED ORDER — LACTATED RINGERS IV SOLN
INTRAVENOUS | Status: DC
  Administered 2016-07-25 (×2): via INTRAVENOUS

## 2016-07-25 MED ORDER — LIDOCAINE-EPINEPHRINE (PF) 1 %-1:200000 IJ SOLN
INTRAMUSCULAR | Status: AC
  Filled 2016-07-25: qty 30

## 2016-07-25 MED ORDER — PROPOFOL 10 MG/ML IV BOLUS
INTRAVENOUS | Status: AC
  Filled 2016-07-25: qty 20

## 2016-07-25 MED ORDER — HYDROCODONE-ACETAMINOPHEN 7.5-325 MG PO TABS: 1.0000 | ORAL_TABLET | Freq: Once | ORAL | Status: DC | PRN

## 2016-07-25 MED ORDER — LIDOCAINE-EPINEPHRINE 0.5 %-1:200000 IJ SOLN
INTRAMUSCULAR | Status: AC
  Filled 2016-07-25: qty 1

## 2016-07-25 MED ORDER — DEXAMETHASONE SODIUM PHOSPHATE 10 MG/ML IJ SOLN
INTRAMUSCULAR | Status: DC | PRN
  Administered 2016-07-25: 10 mg via INTRAVENOUS

## 2016-07-25 MED ORDER — HYDROMORPHONE HCL 1 MG/ML IJ SOLN
INTRAMUSCULAR | Status: AC
  Filled 2016-07-25: qty 1

## 2016-07-25 MED ORDER — HYDROMORPHONE HCL 1 MG/ML IJ SOLN: 0.2500 mg | INTRAMUSCULAR | Status: DC | PRN

## 2016-07-25 MED ORDER — FENTANYL CITRATE (PF) 100 MCG/2ML IJ SOLN
INTRAMUSCULAR | Status: AC
  Filled 2016-07-25: qty 2

## 2016-07-25 MED ORDER — SCOPOLAMINE 1 MG/3DAYS TD PT72
MEDICATED_PATCH | TRANSDERMAL | Status: AC
  Administered 2016-07-25: 1.5 mg via TRANSDERMAL
  Filled 2016-07-25: qty 1

## 2016-07-25 MED ORDER — OXYCODONE-ACETAMINOPHEN 5-325 MG PO TABS: 1.0000 | ORAL_TABLET | ORAL | 0 refills | Status: DC | PRN

## 2016-07-25 MED ORDER — DIPHENHYDRAMINE HCL 50 MG/ML IJ SOLN
INTRAMUSCULAR | Status: AC
  Administered 2016-07-25: 12.5 mg via INTRAVENOUS
  Filled 2016-07-25: qty 1

## 2016-07-25 MED ORDER — BUPIVACAINE HCL 0.25 % IJ SOLN
INTRAMUSCULAR | Status: DC | PRN
  Administered 2016-07-25: 10 mL
  Administered 2016-07-25: 5 mL

## 2016-07-25 MED ORDER — FENTANYL CITRATE (PF) 100 MCG/2ML IJ SOLN
INTRAMUSCULAR | Status: DC | PRN
  Administered 2016-07-25: 100 ug via INTRAVENOUS

## 2016-07-25 MED ORDER — LIDOCAINE HCL (CARDIAC) 20 MG/ML IV SOLN
INTRAVENOUS | Status: AC
  Filled 2016-07-25: qty 5

## 2016-07-25 MED ORDER — ONDANSETRON HCL 4 MG/2ML IJ SOLN
INTRAMUSCULAR | Status: DC | PRN
  Administered 2016-07-25: 4 mg via INTRAVENOUS

## 2016-07-25 MED ORDER — MIDAZOLAM HCL 2 MG/2ML IJ SOLN
INTRAMUSCULAR | Status: AC
  Filled 2016-07-25: qty 2

## 2016-07-25 MED ORDER — ONDANSETRON HCL 4 MG/2ML IJ SOLN
INTRAMUSCULAR | Status: AC
  Filled 2016-07-25: qty 2

## 2016-07-25 MED ORDER — MIDAZOLAM HCL 5 MG/5ML IJ SOLN
INTRAMUSCULAR | Status: DC | PRN
  Administered 2016-07-25: 2 mg via INTRAVENOUS

## 2016-07-25 MED ORDER — ROCURONIUM BROMIDE 100 MG/10ML IV SOLN
INTRAVENOUS | Status: AC
  Filled 2016-07-25: qty 1

## 2016-07-25 MED ORDER — KETOROLAC TROMETHAMINE 30 MG/ML IJ SOLN
INTRAMUSCULAR | Status: AC
  Filled 2016-07-25: qty 1

## 2016-07-25 MED ORDER — MEPERIDINE HCL 25 MG/ML IJ SOLN: 6.2500 mg | INTRAMUSCULAR | Status: DC | PRN

## 2016-07-25 MED ORDER — PROPOFOL 10 MG/ML IV BOLUS
INTRAVENOUS | Status: DC | PRN
  Administered 2016-07-25: 200 mg via INTRAVENOUS

## 2016-07-25 MED ORDER — SCOPOLAMINE 1 MG/3DAYS TD PT72
1.0000 | MEDICATED_PATCH | Freq: Once | TRANSDERMAL | Status: DC
  Administered 2016-07-25: 1.5 mg via TRANSDERMAL

## 2016-07-25 MED ORDER — KETOROLAC TROMETHAMINE 30 MG/ML IJ SOLN
INTRAMUSCULAR | Status: DC | PRN
  Administered 2016-07-25: 30 mg via INTRAVENOUS

## 2016-07-25 SURGICAL SUPPLY — 29 items
CANISTER SUCT 3000ML (MISCELLANEOUS) IMPLANT
CLOTH BEACON ORANGE TIMEOUT ST (SAFETY) ×3 IMPLANT
CONT PATH 16OZ SNAP LID 3702 (MISCELLANEOUS) ×3 IMPLANT
DECANTER SPIKE VIAL GLASS SM (MISCELLANEOUS) ×3 IMPLANT
DRSG OPSITE POSTOP 3X4 (GAUZE/BANDAGES/DRESSINGS) ×3 IMPLANT
DURAPREP 26ML APPLICATOR (WOUND CARE) ×3 IMPLANT
GLOVE BIO SURGEON STRL SZ7 (GLOVE) ×3 IMPLANT
GLOVE BIOGEL PI IND STRL 7.0 (GLOVE) ×1 IMPLANT
GLOVE BIOGEL PI INDICATOR 7.0 (GLOVE) ×2
LIGASURE VESSEL 5MM BLUNT TIP (ELECTROSURGICAL) ×3 IMPLANT
LIQUID BAND (GAUZE/BANDAGES/DRESSINGS) ×3 IMPLANT
NEEDLE HYPO 22GX1.5 SAFETY (NEEDLE) IMPLANT
NS IRRIG 1000ML POUR BTL (IV SOLUTION) ×3 IMPLANT
PACK LAPAROSCOPY BASIN (CUSTOM PROCEDURE TRAY) ×3 IMPLANT
PACK TRENDGUARD 450 HYBRID PRO (MISCELLANEOUS) ×1 IMPLANT
PACK TRENDGUARD 600 HYBRD PROC (MISCELLANEOUS) IMPLANT
POUCH SPECIMEN RETRIEVAL 10MM (ENDOMECHANICALS) IMPLANT
PROTECTOR NERVE ULNAR (MISCELLANEOUS) ×6 IMPLANT
SUT VIC AB 3-0 PS2 18 (SUTURE) ×2
SUT VIC AB 3-0 PS2 18XBRD (SUTURE) ×1 IMPLANT
SUT VICRYL 0 UR6 27IN ABS (SUTURE) ×6 IMPLANT
TOWEL OR 17X24 6PK STRL BLUE (TOWEL DISPOSABLE) ×6 IMPLANT
TRENDGUARD 450 HYBRID PRO PACK (MISCELLANEOUS) ×2
TRENDGUARD 600 HYBRID PROC PK (MISCELLANEOUS)
TROCAR BALLN 12MMX100 BLUNT (TROCAR) ×3 IMPLANT
TROCAR OPTI TIP 5M 100M (ENDOMECHANICALS) ×3 IMPLANT
TROCAR XCEL OPT SLVE 5M 100M (ENDOMECHANICALS) ×3 IMPLANT
WARMER LAPAROSCOPE (MISCELLANEOUS) ×3 IMPLANT
WATER STERILE IRR 1000ML POUR (IV SOLUTION) IMPLANT

## 2016-07-25 NOTE — H&P (Signed)
Anna Brady is an 35 y.o. female.  35 yo G1P1 presents for laparoscopic bilateral salpingectomy for desired permanent sterilization. The patient does not desire future fertility. Her mother had breast cancer at 64 and the patient recently had a benign breast biopsy. She wishe to discontinue hormonal contraception. R/B/A of the procedure were discussed at length and she wishes to proceed  Patient's last menstrual period was 06/30/2016 (approximate).    Past Medical History:  Diagnosis Date  . GERD (gastroesophageal reflux disease)    diet controlled  . IBS (irritable bowel syndrome)   . IBS (irritable bowel syndrome)   . PONV (postoperative nausea and vomiting)   . Reflux     Past Surgical History:  Procedure Laterality Date  . CESAREAN SECTION  08/23/2011   Procedure: CESAREAN SECTION;  Surgeon: Almon Hercules;  Location: [facility];  Service: Gynecology;  Laterality: N/A;  primary of baby  girl at 0302  APGAR 4/9, cord ph 7.24    Family History  Problem Relation Age of Onset  . Hypertension Mother   . Cancer Mother     breast  . Heart disease Father   . Diabetes Father   . Hypertension Maternal Aunt   . Hypertension Maternal Uncle     x3  . Diabetes Maternal Uncle     x3  . Cancer Paternal Aunt     breast  . Diabetes Paternal Aunt   . Heart disease Paternal Uncle   . Hypertension Maternal Grandmother   . Cancer Maternal Grandmother     ovarian  . Diabetes Maternal Grandmother   . Heart disease Maternal Grandfather   . Hypertension Maternal Grandfather   . Diabetes Maternal Grandfather   . Cancer Paternal Grandfather     liver  . Hypertension Cousin   . Anesthesia problems Neg Hx   . Hypotension Neg Hx   . Malignant hyperthermia Neg Hx   . Pseudochol deficiency Neg Hx     Social History:  reports that she has never smoked. She has never used smokeless tobacco. She reports that she does not drink alcohol or use drugs.  Allergies:  Allergies  Allergen  Reactions  . Yasmin [Drospirenone-Ethinyl Estradiol] Hives    Prescriptions Prior to Admission  Medication Sig Dispense Refill Last Dose  . aspirin-acetaminophen-caffeine (EXCEDRIN MIGRAINE) 250-250-65 MG tablet Take 2 tablets by mouth every 6 (six) hours as needed for headache.   Past Month at Unknown time  . calcium carbonate (TUMS - DOSED IN MG ELEMENTAL CALCIUM) 500 MG chewable tablet Chew 1 tablet by mouth daily.     Past Month at Unknown time  . etonogestrel-ethinyl estradiol (NUVARING) 0.12-0.015 MG/24HR vaginal ring Place 1 each vaginally every 28 (twenty-eight) days. Insert vaginally and leave in place for 3 consecutive weeks, then remove for 1 week.       ROS  Blood pressure (!) 148/96, pulse 80, temperature 98.1 F (36.7 C), temperature source Oral, resp. rate 16, last menstrual period 06/30/2016, SpO2 100 %, unknown if currently breastfeeding. Physical Exam   AOX3, NAD Normal work of breathing Abd soft/NT/ND   Results for orders placed or performed during the hospital encounter of 07/25/16 (from the past 24 hour(s))  Pregnancy, urine     Status: None   Collection Time: 07/25/16 10:30 AM  Result Value Ref Range   Preg Test, Ur NEGATIVE NEGATIVE    No results found.  Assessment/Plan: 1) Admit 2) L/S bilateral salpingectomy 3) SCDs for DVT prophylaxis  Anna Brady H. 07/25/2016,  10:58 AM

## 2016-07-25 NOTE — Discharge Instructions (Signed)

## 2016-07-25 NOTE — Anesthesia Postprocedure Evaluation (Signed)
Anesthesia Post Note  Patient: Layne BentonBrittany C Levier  Procedure(s) Performed: Procedure(s) (LRB): LAPAROSCOPIC BILATERAL SALPINGECTOMY (Bilateral)  Patient location during evaluation: PACU Anesthesia Type: General Level of consciousness: sedated Pain management: satisfactory to patient Vital Signs Assessment: post-procedure vital signs reviewed and stable Respiratory status: spontaneous breathing Cardiovascular status: stable Anesthetic complications: no     Last Vitals:  Vitals:   07/25/16 1345 07/25/16 1400  BP: 123/77 110/87  Pulse: 68 71  Resp: 14 12  Temp:  36.7 C    Last Pain:  Vitals:   07/25/16 1048  TempSrc: Oral   Pain Goal: Patients Stated Pain Goal: 3 (07/25/16 1308)               Dao Memmott EDWARD

## 2016-07-25 NOTE — Op Note (Signed)
Pre-Operative Diagnosis: 1) desired permanent sterilization Postoperative Diagnosis: Same Procedure: Laparoscopic bilateral salpingectomy Surgeon: Dr. Waynard ReedsKendra Floy Angert Assistant: Dr. Shawn Stallicky Kaplan Anesthesia: Gen. Endotracheal anesthesia and 10 cc of 0.25% Marcaine injected infraumbilically. Operative Findings:Normal appearing ovaries, tubes, and uterus.  Specimen:None ZOX:[id number]:Minimal  Procedure: Ms. Charm BargesButler is an [age] year old gravida 1 para 1 who presents for desired permanent sterilization. Risks benefits and alternatives of the procedure were discussed at length with the patient and she wishes to proceed. Following the appropriate informed consent the patient was taken to the operating room. She was placed in dorsal lithotomy position, and general anesthesia was administered. The abdomen, perineum, and vagina, were prepped in the normal sterile fashion. Speculum was placed in the vagina and a Hulka uterine manipulator was placed transcervically. The speculum was removed from the vagina. Attention was then turned to the abdominal portion of the case. Following the appropriate draping, 10 cc of 0.25% Marcaine was injected infraumbilically. Allis clamps were used to grasp and elevate the skin. A scalpel was used to make a semilunar infraumbilical incision. Blunt dissection was used down to the level of the fascia. The fascia was grasped with Coker clamps x2 and tented up and fascia was entered using Mayo scissors. Intra-abdominal and she was confirmed with palpation. A Hassan port was placed, the balloon inflated, and pneumoperitoneum was achieved. The intra-abdominal cavity was inspected and findings were as above. Attention was turned to the right lower quadrant. A clear space was noted from the skin. 5 cc of 1% lidocaine was injected into the subcutaneous tissue and a 5 mm incision was made and a 5 mm trocar was placed under direct visualization. The same procedure was repeated on the left lower quadrant. The  uterus was elevated and the patient was placed in Trendelenburg. Attention was turned to the right fallopian tube. The fallopian tube was grasped with an atraumatic grasper at the fimbriated end. The LigaSure device was used to successive bite down the mesosalpinx to the level of the fundus of the uterus where the fallopian tube was transected and removed. The same procedure was repeated on the left fallopian tube. The surgical sites were inspected and noted to be hemostatic. The right and left port sites were removed under direct visualization and noted to be hemostatic. The abdomen was desufflated. During desufflation the surgical sites were visualized and hemostasis was maintained. The camera was removed from the umbilical port site and the umbilical port was removed. This completed the procedure. The fascia was closed with #1 Vicryl in a running fashion, the skin was closed in a subcuticular fashion, and Dermabond was placed. A Hulka manipulator was removed from the vagina. All sponge lap needle counts were correct x2.  The patient tolerated the procedure well and was brought to the recovery room in stable condition

## 2016-07-25 NOTE — Anesthesia Preprocedure Evaluation (Signed)
Anesthesia Evaluation  Patient identified by MRN, date of birth, ID band Patient awake    Reviewed: Allergy & Precautions, NPO status , Patient's Chart, lab work & pertinent test results  History of Anesthesia Complications (+) PONV and history of anesthetic complications  Airway Mallampati: I       Dental no notable dental hx. (+) Teeth Intact   Pulmonary neg pulmonary ROS,    Pulmonary exam normal breath sounds clear to auscultation       Cardiovascular negative cardio ROS Normal cardiovascular exam Rhythm:Regular Rate:Normal     Neuro/Psych  Headaches, negative psych ROS   GI/Hepatic Neg liver ROS, GERD  ,IBS   Endo/Other  negative endocrine ROS  Renal/GU negative Renal ROS  negative genitourinary   Musculoskeletal negative musculoskeletal ROS (+)   Abdominal   Peds  Hematology negative hematology ROS (+)   Anesthesia Other Findings   Reproductive/Obstetrics Desires permanent sterilization                             Lab Results  Component Value Date   WBC 5.3 07/18/2016   HGB 14.1 07/18/2016   HCT 40.3 07/18/2016   MCV 90.6 07/18/2016   PLT 271 07/18/2016    Anesthesia Physical Anesthesia Plan  ASA: II  Anesthesia Plan: General   Post-op Pain Management:    Induction: Intravenous  Airway Management Planned: Oral ETT  Additional Equipment:   Intra-op Plan:   Post-operative Plan: Extubation in OR  Informed Consent: I have reviewed the patients History and Physical, chart, labs and discussed the procedure including the risks, benefits and alternatives for the proposed anesthesia with the patient or authorized representative who has indicated his/her understanding and acceptance.   Dental advisory given  Plan Discussed with: Anesthesiologist, CRNA and Surgeon  Anesthesia Plan Comments:         Anesthesia Quick Evaluation

## 2016-07-25 NOTE — Anesthesia Procedure Notes (Signed)
Procedure Name: Intubation Date/Time: 07/25/2016 11:57 AM Performed by: Junious SilkGILBERT, Hermon Zea Pre-anesthesia Checklist: Patient identified, Emergency Drugs available, Suction available, Patient being monitored and Timeout performed Patient Re-evaluated:Patient Re-evaluated prior to inductionOxygen Delivery Method: Circle system utilized Preoxygenation: Pre-oxygenation with 100% oxygen Intubation Type: IV induction Ventilation: Mask ventilation without difficulty Laryngoscope Size: Miller and 2 Grade View: Grade I Tube type: Oral Tube size: 7.0 mm Number of attempts: 1 Airway Equipment and Method: Stylet Placement Confirmation: ETT inserted through vocal cords under direct vision,  positive ETCO2,  CO2 detector and breath sounds checked- equal and bilateral Secured at: 21 cm Tube secured with: Tape Dental Injury: Teeth and Oropharynx as per pre-operative assessment

## 2016-07-25 NOTE — Transfer of Care (Signed)
Immediate Anesthesia Transfer of Care Note  Patient: Anna Brady  Procedure(s) Performed: Procedure(s): LAPAROSCOPIC BILATERAL SALPINGECTOMY (Bilateral)  Patient Location: PACU  Anesthesia Type:General  Level of Consciousness: awake, alert  and oriented  Airway & Oxygen Therapy: Patient Spontanous Breathing and Patient connected to nasal cannula oxygen  Post-op Assessment: Report given to RN and Post -op Vital signs reviewed and stable  Post vital signs: Reviewed and stable  Last Vitals:  Vitals:   07/25/16 1048  BP: (!) 148/96  Pulse: 80  Resp: 16  Temp: 36.7 C    Last Pain:  Vitals:   07/25/16 1048  TempSrc: Oral      Patients Stated Pain Goal: 3 (07/25/16 1048)  Complications: No apparent anesthesia complications

## 2016-07-28 ENCOUNTER — Encounter (HOSPITAL_COMMUNITY): Payer: Self-pay | Admitting: Obstetrics and Gynecology

## 2017-03-02 ENCOUNTER — Other Ambulatory Visit: Payer: Self-pay | Admitting: Obstetrics and Gynecology

## 2017-03-03 LAB — CYTOLOGY - PAP

## 2018-06-08 ENCOUNTER — Other Ambulatory Visit: Payer: Self-pay | Admitting: Obstetrics and Gynecology

## 2018-06-08 DIAGNOSIS — Z1231 Encounter for screening mammogram for malignant neoplasm of breast: Secondary | ICD-10-CM

## 2018-09-22 ENCOUNTER — Other Ambulatory Visit: Payer: Self-pay | Admitting: Obstetrics and Gynecology

## 2018-09-22 DIAGNOSIS — Z803 Family history of malignant neoplasm of breast: Secondary | ICD-10-CM

## 2019-12-25 ENCOUNTER — Ambulatory Visit: Payer: BC Managed Care – PPO

## 2020-05-22 ENCOUNTER — Other Ambulatory Visit: Payer: Self-pay | Admitting: Otolaryngology

## 2020-05-22 ENCOUNTER — Other Ambulatory Visit (HOSPITAL_COMMUNITY): Payer: Self-pay | Admitting: Otolaryngology

## 2020-05-22 DIAGNOSIS — J320 Chronic maxillary sinusitis: Secondary | ICD-10-CM

## 2020-06-12 ENCOUNTER — Other Ambulatory Visit: Payer: Self-pay

## 2020-06-12 ENCOUNTER — Ambulatory Visit (HOSPITAL_COMMUNITY)
Admission: RE | Admit: 2020-06-12 | Discharge: 2020-06-12 | Disposition: A | Discharge status: Home / Self Care | Payer: BC Managed Care – PPO | Source: Ambulatory Visit | Attending: Otolaryngology | Admitting: Otolaryngology

## 2020-06-12 DIAGNOSIS — J320 Chronic maxillary sinusitis: Secondary | ICD-10-CM | POA: Insufficient documentation

## 2020-06-26 ENCOUNTER — Other Ambulatory Visit: Payer: Self-pay | Admitting: Otolaryngology

## 2020-07-25 ENCOUNTER — Other Ambulatory Visit: Payer: Self-pay | Admitting: Obstetrics and Gynecology

## 2020-07-25 DIAGNOSIS — R928 Other abnormal and inconclusive findings on diagnostic imaging of breast: Secondary | ICD-10-CM

## 2020-08-02 ENCOUNTER — Other Ambulatory Visit: Payer: Self-pay | Admitting: Obstetrics and Gynecology

## 2020-08-02 ENCOUNTER — Ambulatory Visit
Admission: RE | Admit: 2020-08-02 | Discharge: 2020-08-02 | Disposition: A | Discharge status: Home / Self Care | Payer: BC Managed Care – PPO | Source: Ambulatory Visit | Attending: Obstetrics and Gynecology | Admitting: Obstetrics and Gynecology

## 2020-08-02 ENCOUNTER — Other Ambulatory Visit: Payer: Self-pay

## 2020-08-02 DIAGNOSIS — R928 Other abnormal and inconclusive findings on diagnostic imaging of breast: Secondary | ICD-10-CM

## 2020-08-02 DIAGNOSIS — N6489 Other specified disorders of breast: Secondary | ICD-10-CM

## 2020-08-27 ENCOUNTER — Other Ambulatory Visit: Payer: Self-pay

## 2020-08-27 ENCOUNTER — Encounter (HOSPITAL_BASED_OUTPATIENT_CLINIC_OR_DEPARTMENT_OTHER): Payer: Self-pay | Admitting: Otolaryngology

## 2020-08-30 ENCOUNTER — Inpatient Hospital Stay (HOSPITAL_COMMUNITY): Admission: RE | Admit: 2020-08-30 | Payer: BC Managed Care – PPO | Source: Ambulatory Visit

## 2020-08-31 ENCOUNTER — Encounter (HOSPITAL_BASED_OUTPATIENT_CLINIC_OR_DEPARTMENT_OTHER)
Admission: RE | Admit: 2020-08-31 | Discharge: 2020-08-31 | Disposition: A | Discharge status: Home / Self Care | Payer: BC Managed Care – PPO | Source: Ambulatory Visit | Attending: Otolaryngology | Admitting: Otolaryngology

## 2020-08-31 ENCOUNTER — Other Ambulatory Visit (HOSPITAL_COMMUNITY)
Admission: RE | Admit: 2020-08-31 | Discharge: 2020-08-31 | Disposition: A | Discharge status: Home / Self Care | Payer: BC Managed Care – PPO | Source: Ambulatory Visit | Attending: Otolaryngology | Admitting: Otolaryngology

## 2020-08-31 DIAGNOSIS — Z01810 Encounter for preprocedural cardiovascular examination: Secondary | ICD-10-CM | POA: Insufficient documentation

## 2020-08-31 DIAGNOSIS — Z20822 Contact with and (suspected) exposure to covid-19: Secondary | ICD-10-CM | POA: Insufficient documentation

## 2020-08-31 DIAGNOSIS — R030 Elevated blood-pressure reading, without diagnosis of hypertension: Secondary | ICD-10-CM | POA: Insufficient documentation

## 2020-08-31 DIAGNOSIS — Z01812 Encounter for preprocedural laboratory examination: Secondary | ICD-10-CM | POA: Insufficient documentation

## 2020-08-31 DIAGNOSIS — J310 Chronic rhinitis: Secondary | ICD-10-CM | POA: Diagnosis not present

## 2020-08-31 DIAGNOSIS — J342 Deviated nasal septum: Secondary | ICD-10-CM | POA: Diagnosis not present

## 2020-08-31 DIAGNOSIS — J343 Hypertrophy of nasal turbinates: Secondary | ICD-10-CM | POA: Diagnosis not present

## 2020-08-31 LAB — SARS CORONAVIRUS 2 (TAT 6-24 HRS): SARS Coronavirus 2: NEGATIVE

## 2020-09-03 ENCOUNTER — Encounter (HOSPITAL_BASED_OUTPATIENT_CLINIC_OR_DEPARTMENT_OTHER)
Admission: RE | Disposition: A | Discharge status: Home / Self Care | Payer: Self-pay | Source: Home / Self Care | Attending: Otolaryngology

## 2020-09-03 ENCOUNTER — Encounter (HOSPITAL_BASED_OUTPATIENT_CLINIC_OR_DEPARTMENT_OTHER): Payer: Self-pay | Admitting: Otolaryngology

## 2020-09-03 ENCOUNTER — Ambulatory Visit (HOSPITAL_BASED_OUTPATIENT_CLINIC_OR_DEPARTMENT_OTHER): Payer: BC Managed Care – PPO | Admitting: Anesthesiology

## 2020-09-03 ENCOUNTER — Other Ambulatory Visit: Payer: Self-pay

## 2020-09-03 ENCOUNTER — Ambulatory Visit (HOSPITAL_BASED_OUTPATIENT_CLINIC_OR_DEPARTMENT_OTHER)
Admission: RE | Admit: 2020-09-03 | Discharge: 2020-09-03 | Disposition: A | Discharge status: Home / Self Care | Payer: BC Managed Care – PPO | Attending: Otolaryngology | Admitting: Otolaryngology

## 2020-09-03 DIAGNOSIS — Z20822 Contact with and (suspected) exposure to covid-19: Secondary | ICD-10-CM | POA: Insufficient documentation

## 2020-09-03 DIAGNOSIS — J342 Deviated nasal septum: Secondary | ICD-10-CM | POA: Insufficient documentation

## 2020-09-03 DIAGNOSIS — J310 Chronic rhinitis: Secondary | ICD-10-CM | POA: Insufficient documentation

## 2020-09-03 DIAGNOSIS — J343 Hypertrophy of nasal turbinates: Secondary | ICD-10-CM | POA: Insufficient documentation

## 2020-09-03 HISTORY — PX: NASAL SEPTOPLASTY W/ TURBINOPLASTY: SHX2070

## 2020-09-03 HISTORY — DX: Essential (primary) hypertension: I10

## 2020-09-03 LAB — POCT PREGNANCY, URINE: Preg Test, Ur: NEGATIVE

## 2020-09-03 SURGERY — SEPTOPLASTY, NOSE, WITH NASAL TURBINATE REDUCTION
Anesthesia: General | Laterality: Bilateral

## 2020-09-03 MED ORDER — OXYMETAZOLINE HCL 0.05 % NA SOLN
NASAL | Status: DC | PRN
  Administered 2020-09-03: 1

## 2020-09-03 MED ORDER — MIDAZOLAM HCL 5 MG/5ML IJ SOLN
INTRAMUSCULAR | Status: DC | PRN
  Administered 2020-09-03: 2 mg via INTRAVENOUS

## 2020-09-03 MED ORDER — MUPIROCIN 2 % EX OINT
TOPICAL_OINTMENT | CUTANEOUS | Status: DC | PRN
  Administered 2020-09-03: 1 via NASAL

## 2020-09-03 MED ORDER — AMOXICILLIN 875 MG PO TABS: 875.0000 mg | ORAL_TABLET | Freq: Two times a day (BID) | ORAL | 0 refills | Status: AC

## 2020-09-03 MED ORDER — DEXAMETHASONE SODIUM PHOSPHATE 10 MG/ML IJ SOLN
INTRAMUSCULAR | Status: DC | PRN
  Administered 2020-09-03: 10 mg via INTRAVENOUS

## 2020-09-03 MED ORDER — MEPERIDINE HCL 25 MG/ML IJ SOLN: 6.2500 mg | INTRAMUSCULAR | Status: DC | PRN

## 2020-09-03 MED ORDER — OXYCODONE HCL 5 MG PO TABS: 5.0000 mg | ORAL_TABLET | Freq: Once | ORAL | Status: DC | PRN

## 2020-09-03 MED ORDER — LACTATED RINGERS IV SOLN: INTRAVENOUS | Status: DC

## 2020-09-03 MED ORDER — FENTANYL CITRATE (PF) 100 MCG/2ML IJ SOLN
INTRAMUSCULAR | Status: AC
  Filled 2020-09-03: qty 2

## 2020-09-03 MED ORDER — OXYCODONE HCL 5 MG/5ML PO SOLN: 5.0000 mg | Freq: Once | ORAL | Status: DC | PRN

## 2020-09-03 MED ORDER — PROPOFOL 10 MG/ML IV BOLUS
INTRAVENOUS | Status: DC | PRN
  Administered 2020-09-03: 120 mg via INTRAVENOUS

## 2020-09-03 MED ORDER — ROCURONIUM BROMIDE 10 MG/ML (PF) SYRINGE
PREFILLED_SYRINGE | INTRAVENOUS | Status: AC
  Filled 2020-09-03: qty 10

## 2020-09-03 MED ORDER — LIDOCAINE 2% (20 MG/ML) 5 ML SYRINGE
INTRAMUSCULAR | Status: DC | PRN
  Administered 2020-09-03: 60 mg via INTRAVENOUS

## 2020-09-03 MED ORDER — DEXAMETHASONE SODIUM PHOSPHATE 10 MG/ML IJ SOLN
INTRAMUSCULAR | Status: AC
  Filled 2020-09-03: qty 1

## 2020-09-03 MED ORDER — CEFAZOLIN SODIUM-DEXTROSE 2-3 GM-%(50ML) IV SOLR
INTRAVENOUS | Status: DC | PRN
  Administered 2020-09-03: 2 g via INTRAVENOUS

## 2020-09-03 MED ORDER — DROPERIDOL 2.5 MG/ML IJ SOLN
INTRAMUSCULAR | Status: DC | PRN
  Administered 2020-09-03: .625 mg via INTRAVENOUS

## 2020-09-03 MED ORDER — DROPERIDOL 2.5 MG/ML IJ SOLN
INTRAMUSCULAR | Status: AC
  Filled 2020-09-03: qty 2

## 2020-09-03 MED ORDER — OXYMETAZOLINE HCL 0.05 % NA SOLN
NASAL | Status: AC
  Filled 2020-09-03: qty 30

## 2020-09-03 MED ORDER — ROCURONIUM BROMIDE 100 MG/10ML IV SOLN
INTRAVENOUS | Status: DC | PRN
  Administered 2020-09-03: 60 mg via INTRAVENOUS

## 2020-09-03 MED ORDER — PROMETHAZINE HCL 25 MG/ML IJ SOLN: 6.2500 mg | INTRAMUSCULAR | Status: DC | PRN

## 2020-09-03 MED ORDER — OXYCODONE-ACETAMINOPHEN 5-325 MG PO TABS
1.0000 | ORAL_TABLET | ORAL | 0 refills | Status: AC | PRN
Start: 2020-09-03 — End: 2020-09-06

## 2020-09-03 MED ORDER — SUGAMMADEX SODIUM 200 MG/2ML IV SOLN
INTRAVENOUS | Status: DC | PRN
  Administered 2020-09-03: 150 mg via INTRAVENOUS

## 2020-09-03 MED ORDER — ONDANSETRON HCL 4 MG/2ML IJ SOLN
INTRAMUSCULAR | Status: AC
  Filled 2020-09-03: qty 2

## 2020-09-03 MED ORDER — HYDROMORPHONE HCL 1 MG/ML IJ SOLN: 0.2500 mg | INTRAMUSCULAR | Status: DC | PRN

## 2020-09-03 MED ORDER — LIDOCAINE 2% (20 MG/ML) 5 ML SYRINGE
INTRAMUSCULAR | Status: AC
  Filled 2020-09-03: qty 5

## 2020-09-03 MED ORDER — FENTANYL CITRATE (PF) 100 MCG/2ML IJ SOLN
INTRAMUSCULAR | Status: DC | PRN
  Administered 2020-09-03: 100 ug via INTRAVENOUS

## 2020-09-03 MED ORDER — ONDANSETRON HCL 4 MG/2ML IJ SOLN
INTRAMUSCULAR | Status: DC | PRN
  Administered 2020-09-03: 4 mg via INTRAVENOUS

## 2020-09-03 MED ORDER — LIDOCAINE-EPINEPHRINE 1 %-1:100000 IJ SOLN
INTRAMUSCULAR | Status: DC | PRN
  Administered 2020-09-03: 3 mL

## 2020-09-03 MED ORDER — LIDOCAINE-EPINEPHRINE 1 %-1:100000 IJ SOLN
INTRAMUSCULAR | Status: AC
  Filled 2020-09-03: qty 1

## 2020-09-03 MED ORDER — MIDAZOLAM HCL 2 MG/2ML IJ SOLN
INTRAMUSCULAR | Status: AC
  Filled 2020-09-03: qty 2

## 2020-09-03 MED ORDER — MUPIROCIN 2 % EX OINT
TOPICAL_OINTMENT | CUTANEOUS | Status: AC
  Filled 2020-09-03: qty 22

## 2020-09-03 SURGICAL SUPPLY — 31 items
ATTRACTOMAT 16X20 MAGNETIC DRP (DRAPES) IMPLANT
CANISTER SUCT 1200ML W/VALVE (MISCELLANEOUS) ×3 IMPLANT
COAGULATOR SUCT 8FR VV (MISCELLANEOUS) ×3 IMPLANT
COVER WAND RF STERILE (DRAPES) IMPLANT
DECANTER SPIKE VIAL GLASS SM (MISCELLANEOUS) IMPLANT
DRSG NASOPORE 8CM (GAUZE/BANDAGES/DRESSINGS) IMPLANT
DRSG TELFA 3X8 NADH (GAUZE/BANDAGES/DRESSINGS) IMPLANT
ELECT REM PT RETURN 9FT ADLT (ELECTROSURGICAL) ×3
ELECTRODE REM PT RTRN 9FT ADLT (ELECTROSURGICAL) ×1 IMPLANT
GLOVE BIO SURGEON STRL SZ7.5 (GLOVE) ×3 IMPLANT
GOWN STRL REUS W/ TWL LRG LVL3 (GOWN DISPOSABLE) ×2 IMPLANT
GOWN STRL REUS W/TWL LRG LVL3 (GOWN DISPOSABLE) ×6
NEEDLE HYPO 25X1 1.5 SAFETY (NEEDLE) ×3 IMPLANT
NS IRRIG 1000ML POUR BTL (IV SOLUTION) ×3 IMPLANT
PACK BASIN DAY SURGERY FS (CUSTOM PROCEDURE TRAY) ×3 IMPLANT
PACK ENT DAY SURGERY (CUSTOM PROCEDURE TRAY) ×3 IMPLANT
SLEEVE SCD COMPRESS KNEE MED (MISCELLANEOUS) ×3 IMPLANT
SOLUTION BUTLER CLEAR DIP (MISCELLANEOUS) ×3 IMPLANT
SPLINT NASAL AIRWAY SILICONE (MISCELLANEOUS) ×3 IMPLANT
SPONGE GAUZE 2X2 8PLY STER LF (GAUZE/BANDAGES/DRESSINGS) ×1
SPONGE GAUZE 2X2 8PLY STRL LF (GAUZE/BANDAGES/DRESSINGS) ×2 IMPLANT
SPONGE NEURO XRAY DETECT 1X3 (DISPOSABLE) ×3 IMPLANT
SUT CHROMIC 4 0 P 3 18 (SUTURE) ×3 IMPLANT
SUT PLAIN 4 0 ~~LOC~~ 1 (SUTURE) ×3 IMPLANT
SUT PROLENE 3 0 PS 2 (SUTURE) ×3 IMPLANT
SUT VIC AB 4-0 P-3 18XBRD (SUTURE) IMPLANT
SUT VIC AB 4-0 P3 18 (SUTURE)
TOWEL GREEN STERILE FF (TOWEL DISPOSABLE) ×3 IMPLANT
TUBE SALEM SUMP 12R W/ARV (TUBING) IMPLANT
TUBE SALEM SUMP 16 FR W/ARV (TUBING) ×3 IMPLANT
YANKAUER SUCT BULB TIP NO VENT (SUCTIONS) ×3 IMPLANT

## 2020-09-03 NOTE — H&P (Signed)
Cc: Chronic nasal obstruction, recurrent headaches  HPI: The patient is a 39 year old female who returns today for her follow-up evaluation. The patient was previously seen for chronic nasal congestion and recurrent headaches.  She was noted to have severe nasal septal deviation and bilateral inferior turbinate hypertrophy.  Her right septal spur was noted to be impinging on the right lateral nasal wall.  She was treated with Flonase nasal spray and Zyrtec daily.  She also underwent a sinus CT scan.  The CT showed no evidence of acute or chronic sinusitis.  The patient returns today complaining of persistent nasal obstruction.  She has not responded to her medical treatment so far.  She is interested in more definitive treatment.      Exam: The flexible scope was inserted into the right nasal cavity.  Endoscopy of the interior nasal cavity, superior, inferior, and middle meatus was performed. The sphenoid-ethmoid recess was examined. Edematous mucosa was noted.  No polyp, mass, or lesion was appreciated. Severe nasal septal deviation noted. The patient's right septal spur is noted to be impinging on the right lateral nasal wall.  Olfactory cleft was clear.  Nasopharynx was clear.  Turbinates were hypertrophied but without mass.  Incomplete response to decongestion.  The procedure was repeated on the contralateral side with similar findings.  The patient tolerated the procedure well.  Instructions were given to avoid eating or drinking for 2 hours.  Assessment: 1.  Chronic rhinitis with nasal mucosal congestion, severe nasal septal deviation, and bilateral inferior turbinate hypertrophy.  2.  The patient's right septal spur is noted to be impinging on the right lateral nasal wall.  3.  Chronic headache.  4.  No polyps, mass, lesion or purulent drainage is noted today.   Plan: 1.  The nasal endoscopy findings and the CT images are reviewed with the patient.  2.  Based on the above findings, the patient  will likely benefit from surgical intervention with septoplasty and bilateral inferior turbinate reduction.  The risks, benefits, alternatives and details of the procedures are reviewed with the patient.  3.  The patient would like to proceed with the procedures.

## 2020-09-03 NOTE — Transfer of Care (Signed)
Immediate Anesthesia Transfer of Care Note  Patient: Anna Brady  Procedure(s) Performed: NASAL SEPTOPLASTY WITH TURBINATE REDUCTION (Bilateral )  Patient Location: PACU  Anesthesia Type:General  Level of Consciousness: drowsy  Airway & Oxygen Therapy: Patient Spontanous Breathing and Patient connected to face mask oxygen  Post-op Assessment: Report given to RN and Post -op Vital signs reviewed and stable  Post vital signs: Reviewed and stable  Last Vitals:  Vitals Value Taken Time  BP 127/85 09/03/20 1033  Temp 37.1 C 09/03/20 1033  Pulse 82 09/03/20 1035  Resp 19 09/03/20 1035  SpO2 100 % 09/03/20 1035  Vitals shown include unvalidated device data.  Last Pain:  Vitals:   09/03/20 0750  TempSrc: Oral  PainSc: 0-No pain         Complications: No complications documented.

## 2020-09-03 NOTE — Op Note (Signed)
DATE OF PROCEDURE: 09/03/2020  OPERATIVE REPORT   SURGEON: Newman Pies, MD   PREOPERATIVE DIAGNOSES:  1. Severe nasal septal deviation.  2. Bilateral inferior turbinate hypertrophy.  3. Chronic nasal obstruction.  POSTOPERATIVE DIAGNOSES:  1. Severe nasal septal deviation.  2. Bilateral inferior turbinate hypertrophy.  3. Chronic nasal obstruction.  PROCEDURE PERFORMED:  1. Septoplasty.  2. Bilateral partial inferior turbinate resection.   ANESTHESIA: General endotracheal tube anesthesia.   COMPLICATIONS: None.   ESTIMATED BLOOD LOSS: 150 mL.   INDICATION FOR PROCEDURE: Anna Brady is a 39 y.o. female with a history of chronic nasal obstruction. The patient was treated with antihistamine, decongestant, and steroid nasal sprays. However, the patient continued to be symptomatic. On examination, the patient was noted to have bilateral severe inferior turbinate hypertrophy and significant nasal septal deviation, causing significant nasal obstruction. Based on the above findings, the decision was made for the patient to undergo the above-stated procedures. The risks, benefits, alternatives, and details of the procedures were discussed with the patient. Questions were invited and answered. Informed consent was obtained.   DESCRIPTION OF PROCEDURE: The patient was taken to the operating room and placed supine on the operating table. General endotracheal tube anesthesia was administered by the anesthesiologist. The patient was positioned, and prepped and draped in the standard fashion for nasal surgery. Pledgets soaked with Afrin were placed in both nasal cavities for decongestion. The pledgets were subsequently removed.   Examination of the nasal cavity revealed a severe nasal septal deviation. 1% lidocaine with 1:100,000 epinephrine was injected onto the nasal septum bilaterally. A hemitransfixion incision was made on the left side. The mucosal flap was carefully elevated on the left side. A  cartilaginous incision was made 1 cm superior to the caudal margin of the nasal septum. Mucosal flap was also elevated on the right side in the similar fashion. It should be noted that due to the severe septal deviation, the deviated portion of the cartilaginous and bony septum had to be removed in piecemeal fashion. Once the deviated portions were removed, a straight midline septum was achieved. The septum was then quilted with 4-0 plain gut sutures. The hemitransfixion incision was closed with interrupted 4-0 chromic sutures.   The inferior one half of both hypertrophied inferior turbinate was crossclamped with a Kelly clamp. The inferior one half of each inferior turbinate was then resected with a pair of cross cutting scissors. Hemostasis was achieved with a suction cautery device. Doyle splints were applied to the nasal septum.  The care of the patient was turned over to the anesthesiologist. The patient was awakened from anesthesia without difficulty. The patient was extubated and transferred to the recovery room in good condition.   OPERATIVE FINDINGS: Severe nasal septal deviation and bilateral inferior turbinate hypertrophy.   SPECIMEN: None.   FOLLOWUP CARE: The patient be discharged home once she is awake and alert. The patient will be placed on Percocet 1 tablets p.o. q.4 hours p.r.n. pain, and amoxicillin 875 mg p.o. b.i.d. for 3 days. The patient will follow up in my office in 3 days for splint removal.   Machele Deihl Philomena Doheny, MD

## 2020-09-03 NOTE — Anesthesia Postprocedure Evaluation (Signed)
Anesthesia Post Note  Patient: Anna Brady  Procedure(s) Performed: NASAL SEPTOPLASTY WITH TURBINATE REDUCTION (Bilateral )     Patient location during evaluation: PACU Anesthesia Type: General Level of consciousness: awake and alert Pain management: pain level controlled Vital Signs Assessment: post-procedure vital signs reviewed and stable Respiratory status: spontaneous breathing, nonlabored ventilation and respiratory function stable Cardiovascular status: blood pressure returned to baseline and stable Postop Assessment: no apparent nausea or vomiting Anesthetic complications: no   No complications documented.  Last Vitals:  Vitals:   09/03/20 1059 09/03/20 1108  BP: (!) 140/92 (!) 133/92  Pulse: 80 79  Resp: 16 16  Temp:  36.7 C  SpO2: 98% 98%    Last Pain:  Vitals:   09/03/20 1108  TempSrc:   PainSc: 0-No pain                 Lowella Curb

## 2020-09-03 NOTE — Anesthesia Preprocedure Evaluation (Signed)
Anesthesia Evaluation  Patient identified by MRN, date of birth, ID band Patient awake    Reviewed: Allergy & Precautions, NPO status , Patient's Chart, lab work & pertinent test results  History of Anesthesia Complications (+) PONV and history of anesthetic complications  Airway Mallampati: I  TM Distance: >3 FB Neck ROM: Full    Dental no notable dental hx. (+) Teeth Intact   Pulmonary neg pulmonary ROS,    Pulmonary exam normal breath sounds clear to auscultation       Cardiovascular hypertension, negative cardio ROS Normal cardiovascular exam Rhythm:Regular Rate:Normal     Neuro/Psych  Headaches, negative psych ROS   GI/Hepatic Neg liver ROS, GERD  ,IBS   Endo/Other  negative endocrine ROS  Renal/GU negative Renal ROS  negative genitourinary   Musculoskeletal negative musculoskeletal ROS (+)   Abdominal   Peds  Hematology negative hematology ROS (+)   Anesthesia Other Findings   Reproductive/Obstetrics Desires permanent sterilization                             Lab Results  Component Value Date   WBC 5.3 07/18/2016   HGB 14.1 07/18/2016   HCT 40.3 07/18/2016   MCV 90.6 07/18/2016   PLT 271 07/18/2016    Anesthesia Physical  Anesthesia Plan  ASA: II  Anesthesia Plan: General   Post-op Pain Management:    Induction: Intravenous  PONV Risk Score and Plan: 4 or greater and Ondansetron, Dexamethasone, Midazolam, Droperidol and Treatment may vary due to age or medical condition  Airway Management Planned: Oral ETT  Additional Equipment:   Intra-op Plan:   Post-operative Plan: Extubation in OR  Informed Consent: I have reviewed the patients History and Physical, chart, labs and discussed the procedure including the risks, benefits and alternatives for the proposed anesthesia with the patient or authorized representative who has indicated his/her understanding and  acceptance.     Dental advisory given  Plan Discussed with: Anesthesiologist, CRNA and Surgeon  Anesthesia Plan Comments:         Anesthesia Quick Evaluation

## 2020-09-03 NOTE — Anesthesia Procedure Notes (Signed)
Procedure Name: Intubation Date/Time: 09/03/2020 9:11 AM Performed by: Lauralyn Primes, CRNA Pre-anesthesia Checklist: Patient identified, Emergency Drugs available, Suction available and Patient being monitored Patient Re-evaluated:Patient Re-evaluated prior to induction Oxygen Delivery Method: Circle system utilized Preoxygenation: Pre-oxygenation with 100% oxygen Induction Type: IV induction Ventilation: Mask ventilation without difficulty Laryngoscope Size: Miller and 2 Grade View: Grade I Tube type: Oral Tube size: 7.0 mm Number of attempts: 1 Airway Equipment and Method: Stylet and Oral airway Placement Confirmation: ETT inserted through vocal cords under direct vision,  positive ETCO2 and breath sounds checked- equal and bilateral Secured at: 21 cm Tube secured with: Tape Dental Injury: Teeth and Oropharynx as per pre-operative assessment

## 2020-09-03 NOTE — Discharge Instructions (Signed)
Post Anesthesia Home Care Instructions  Activity: Get plenty of rest for the remainder of the day. A responsible individual must stay with you for 24 hours following the procedure.  For the next 24 hours, DO NOT: -Drive a car -Operate machinery -Drink alcoholic beverages -Take any medication unless instructed by your physician -Make any legal decisions or sign important papers.  Meals: Start with liquid foods such as gelatin or soup. Progress to regular foods as tolerated. Avoid greasy, spicy, heavy foods. If nausea and/or vomiting occur, drink only clear liquids until the nausea and/or vomiting subsides. Call your physician if vomiting continues.  Special Instructions/Symptoms: Your throat may feel dry or sore from the anesthesia or the breathing tube placed in your throat during surgery. If this causes discomfort, gargle with warm salt water. The discomfort should disappear within 24 hours.  ----------------  POSTOPERATIVE INSTRUCTIONS FOR PATIENTS HAVING NASAL OR SINUS OPERATIONS ACTIVITY: Restrict activity at home for the first two days, resting as much as possible. Light activity is best. You may usually return to work within a week. You should refrain from nose blowing, strenuous activity, or heavy lifting greater than 20lbs for a total of one week after your operation.  If sneezing cannot be avoided, sneeze with your mouth open. DISCOMFORT: You may experience a dull headache and pressure along with nasal congestion and discharge. These symptoms may be worse during the first week after the operation but may last as long as two to four weeks.  Please take Tylenol or the pain medication that has been prescribed for you. Do not take aspirin or aspirin containing medications since they may cause bleeding.  You may experience symptoms of post nasal drainage, nasal congestion, headaches and fatigue for two or three months after your operation.  BLEEDING: You may have some blood tinged nasal  drainage for approximately two weeks after the operation.  The discharge will be worse for the first week.  Please call our office at [phone] or go to the nearest hospital emergency room if you experience any of the following: heavy, bright red blood from your nose or mouth that lasts longer than 15 minutes or coughing up or vomiting bright red blood or blood clots. GENERAL CONSIDERATIONS: 1. A gauze dressing will be placed on your upper lip to absorb any drainage after the operation. You may need to change this several times a day.  If you do not have very much drainage, you may remove the dressing.  Remember that you may gently wipe your nose with a tissue and sniff in, but DO NOT blow your nose. 2. Please keep all of your postoperative appointments.  Your final results after the operation will depend on proper follow-up.  The initial visit is usually 2 to 5 days after the operation.  During this visit, the remaining nasal packing and internal septal splints will be removed.  Your nasal and sinus cavities will be cleaned.  During the second visit, your nasal and sinus cavities will be cleaned again. Have someone drive you to your first two postoperative appointments.  3. How you care for your nose after the operation will influence the results that you obtain.  You should follow all directions, take your medication as prescribed, and call our office [phone] with any problems or questions. 4. You may be more comfortable sleeping with your head elevated on two pillows. 5. Do not take any medications that we have not prescribed or recommended. WARNING SIGNS: if any of the following should   occur, please call our office: 1. Persistent fever greater than 102F. 2. Persistent vomiting. 3. Severe and constant pain that is not relieved by prescribed pain medication. 4. Trauma to the nose. 5. Rash or unusual side effects from any medicines.      

## 2020-09-04 ENCOUNTER — Encounter (HOSPITAL_BASED_OUTPATIENT_CLINIC_OR_DEPARTMENT_OTHER): Payer: Self-pay | Admitting: Otolaryngology

## 2021-02-01 ENCOUNTER — Ambulatory Visit: Payer: BC Managed Care – PPO

## 2021-02-01 ENCOUNTER — Other Ambulatory Visit: Payer: Self-pay

## 2021-02-01 ENCOUNTER — Ambulatory Visit
Admission: RE | Admit: 2021-02-01 | Discharge: 2021-02-01 | Disposition: A | Discharge status: Home / Self Care | Payer: BC Managed Care – PPO | Source: Ambulatory Visit | Attending: Obstetrics and Gynecology | Admitting: Obstetrics and Gynecology

## 2021-02-01 DIAGNOSIS — N6489 Other specified disorders of breast: Secondary | ICD-10-CM

## 2021-06-21 ENCOUNTER — Other Ambulatory Visit: Payer: Self-pay | Admitting: Obstetrics and Gynecology

## 2021-06-21 DIAGNOSIS — Z1231 Encounter for screening mammogram for malignant neoplasm of breast: Secondary | ICD-10-CM

## 2021-07-24 ENCOUNTER — Other Ambulatory Visit: Payer: Self-pay

## 2021-07-24 ENCOUNTER — Ambulatory Visit
Admission: RE | Admit: 2021-07-24 | Discharge: 2021-07-24 | Disposition: A | Discharge status: Home / Self Care | Payer: BC Managed Care – PPO | Source: Ambulatory Visit | Attending: Obstetrics and Gynecology | Admitting: Obstetrics and Gynecology

## 2021-07-24 DIAGNOSIS — Z1231 Encounter for screening mammogram for malignant neoplasm of breast: Secondary | ICD-10-CM

## 2021-08-01 ENCOUNTER — Other Ambulatory Visit: Payer: Self-pay | Admitting: Obstetrics and Gynecology

## 2022-01-15 IMAGING — MG MM DIGITAL SCREENING BILAT W/ TOMO AND CAD
6 of 10 series · 6 of 30 positions shown · non-contrast
Comparison: Previous exam(s).

CLINICAL DATA: Screening. Strong family history of breast cancer,
including in patient's mother diagnosed with breast cancer at age
44.

EXAM:
DIGITAL SCREENING BILATERAL MAMMOGRAM WITH TOMOSYNTHESIS AND CAD
TECHNIQUE: Bilateral screening digital craniocaudal and mediolateral oblique
mammograms were obtained. Bilateral screening digital breast
tomosynthesis was performed. The images were evaluated with
computer-aided detection.

[L CC synth-2D]
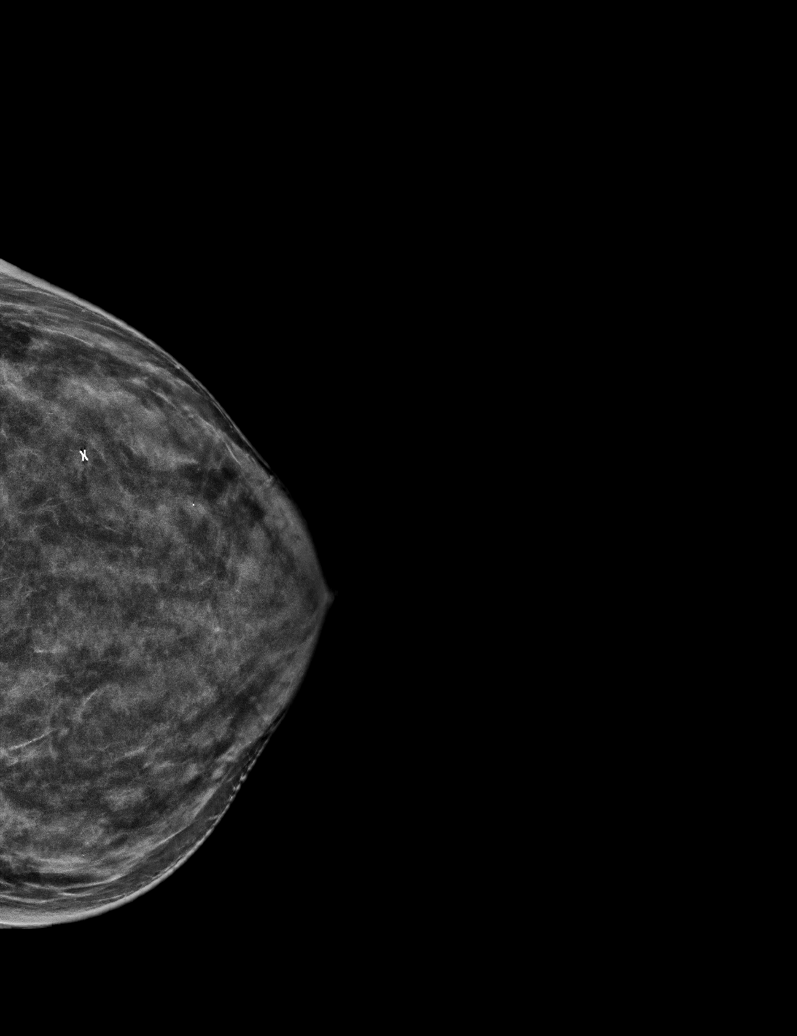

[R CC synth-2D (1 of 2)]
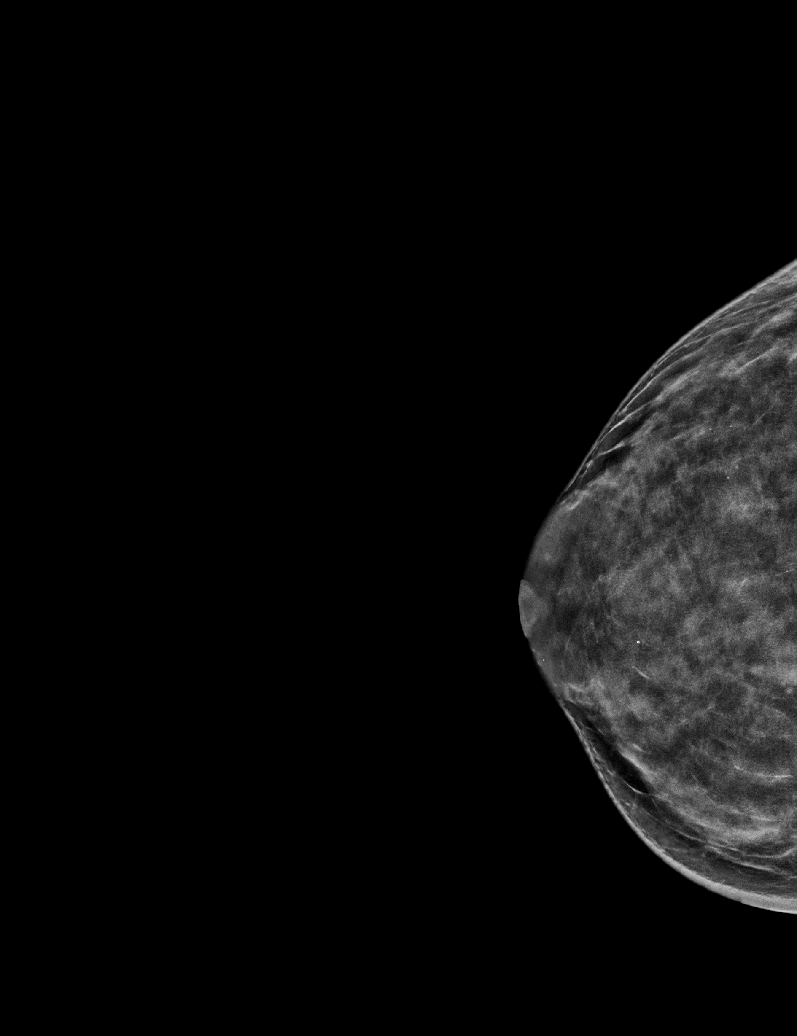

[R CC synth-2D (2 of 2)]
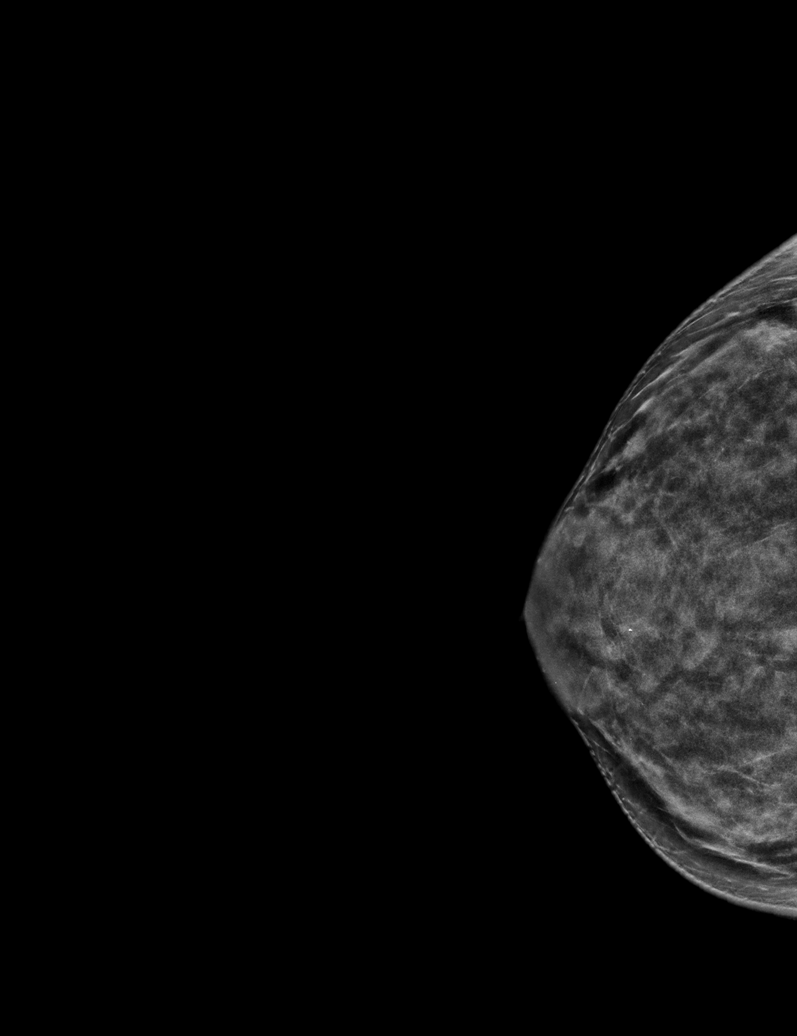

[L MLO synth-2D]
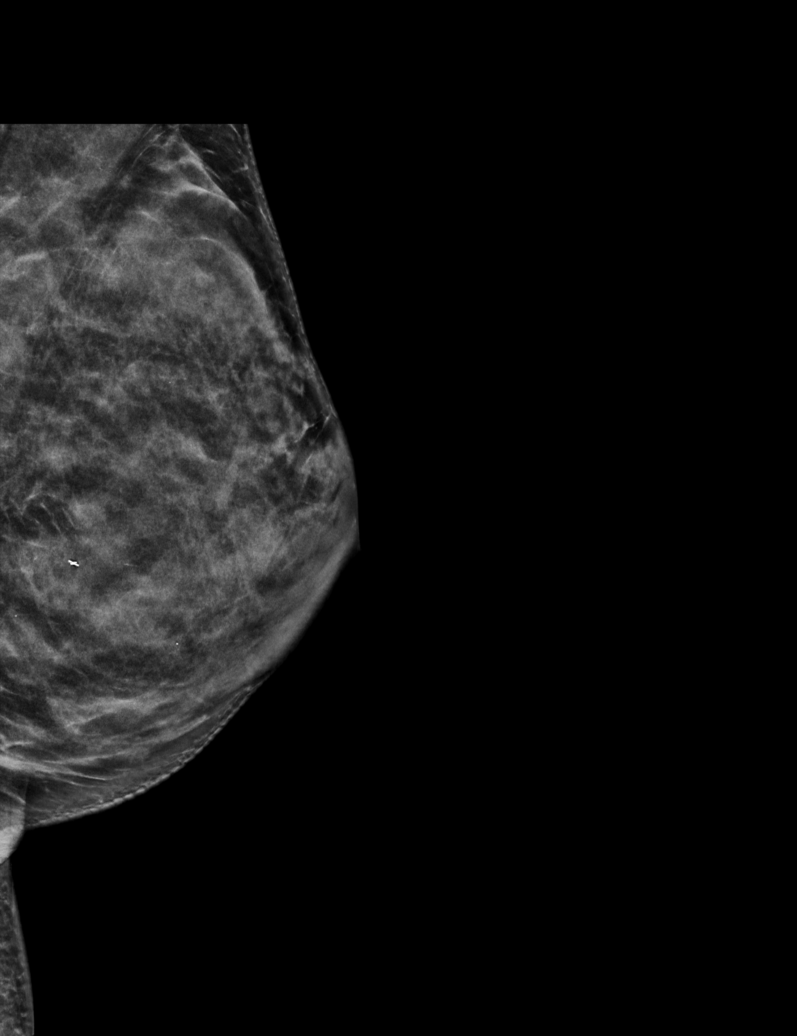

[R MLO synth-2D]
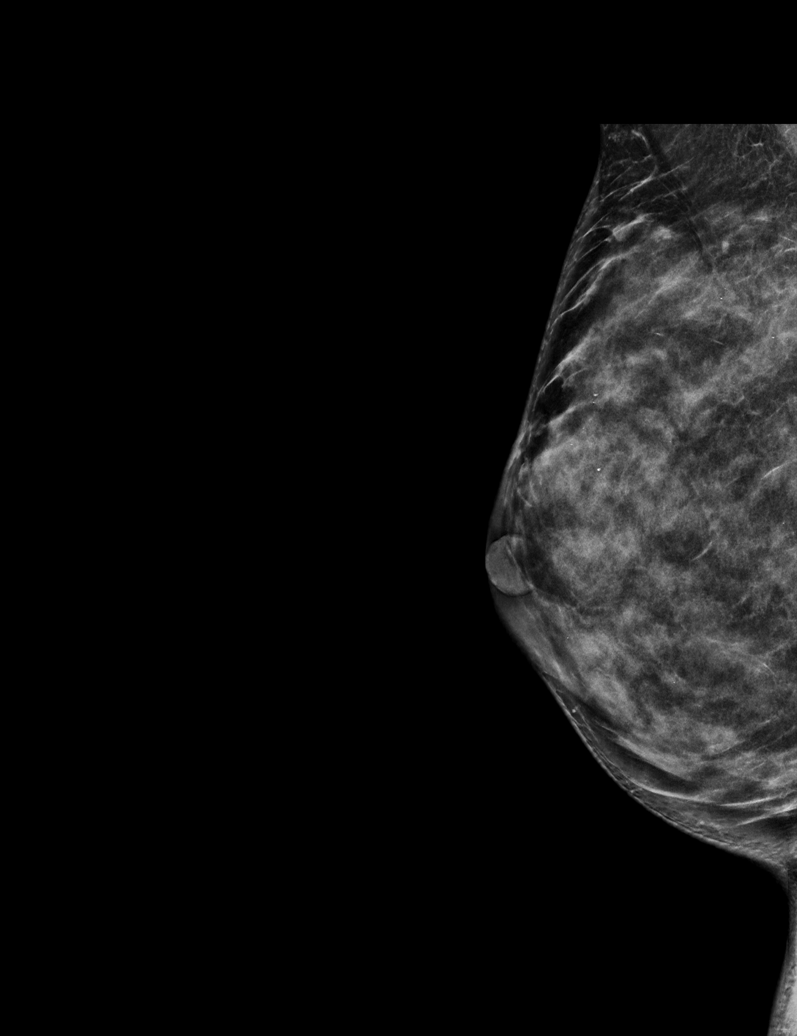

[L MLO tomo · tomo slice 29/57.0]
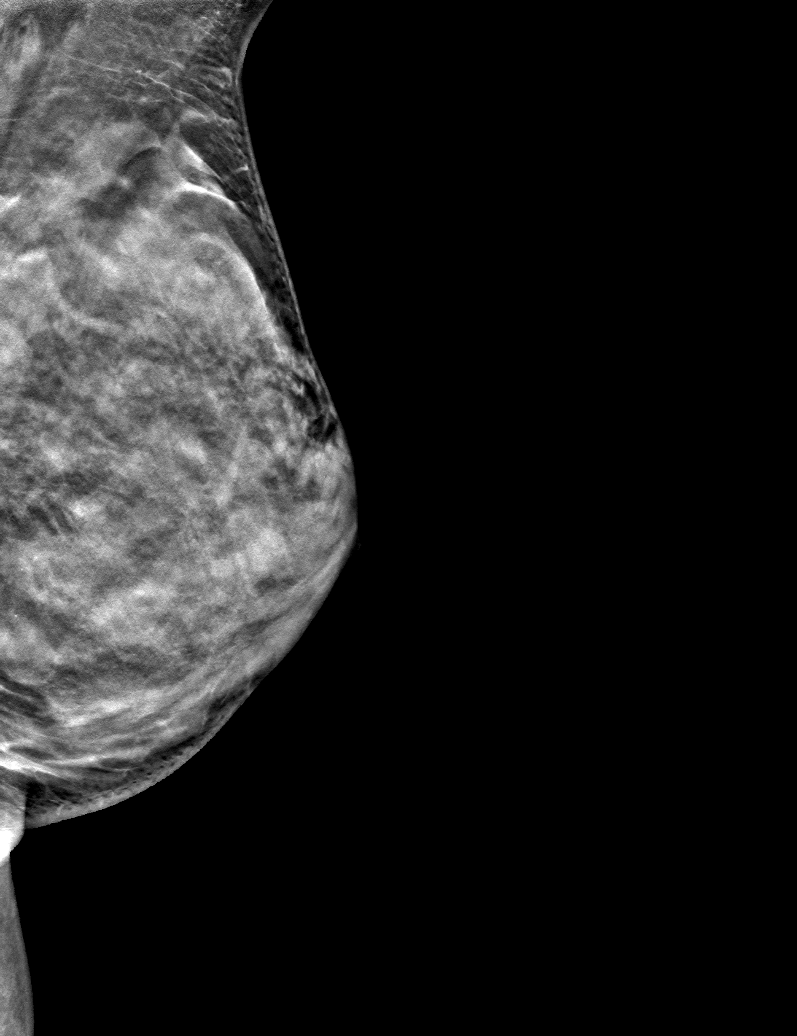

[6 of 30 positions shown; findings below may reference images not displayed]

ACR Breast Density Category d: The breast tissue is extremely dense,
which lowers the sensitivity of mammography
FINDINGS: There are no findings suspicious for malignancy.
IMPRESSION: No mammographic evidence of malignancy. A result letter of this
screening mammogram will be mailed directly to the patient.

RECOMMENDATION:
1.  Screening mammogram in one year. (Code:[id number])

2. Strong family history of breast cancer, with patient's mother
diagnosed with breast cancer at age 44. Given patient's extremely
dense fibroglandular tissue and strong family history of breast
cancer, high risk screening breast MRI should be considered (in
addition to genetic counseling). The [employer]
recommends annual MRI and mammography in patients with an estimated
lifetime risk of developing breast cancer greater than 20 - 25%, or
who are known or suspected to be positive for the breast cancer
gene.

BI-RADS CATEGORY  1: Negative.

## 2022-08-26 ENCOUNTER — Other Ambulatory Visit: Payer: Self-pay | Admitting: Obstetrics and Gynecology

## 2022-08-26 DIAGNOSIS — Z803 Family history of malignant neoplasm of breast: Secondary | ICD-10-CM

## 2022-11-24 NOTE — Progress Notes (Unsigned)
[name]:(336) [phone]   Fax:(336) [place] NOTE  Patient Care Team: Celene Squibb, MD as PCP - General (Internal Medicine)  Hematological/Oncological History 10/02/2022: Labs from PCP, Dr. Rochele Raring [facility]): WBC 3.6 (L), Hgb 11.9 (L), MCV 96.1, Plt 210. 11/25/2022: Establish care with [facility]emat[facility]   CHIEF COMPLAINTS/PURPOSE OF CONSULTATION:  "Leukopenia "  HISTORY OF PRESENTING ILLNESS:  Anna Brady 42 y.o. female with medical history significant for ***  On review of the previous records ***  On exam today ***  MEDICAL HISTORY:  Past Medical History:  Diagnosis Date   GERD (gastroesophageal reflux disease)    diet controlled   Hypertension    IBS (irritable bowel syndrome)    IBS (irritable bowel syndrome)    PONV (postoperative nausea and vomiting)    Reflux     SURGICAL HISTORY: Past Surgical History:  Procedure Laterality Date   CESAREAN SECTION  08/23/2011   Procedure: CESAREAN SECTION;  Surgeon: Marcial Pacas;  Location: [place] [facility];  Service: Gynecology;  Laterality: N/A;  primary of baby  girl at [place] 4/9, cord ph 7.24   LAPAROSCOPIC BILATERAL SALPINGECTOMY Bilateral 07/25/2016   Procedure: LAPAROSCOPIC BILATERAL SALPINGECTOMY;  Surgeon: Vanessa Kick, MD;  Location: [facility];  Service: Gynecology;  Laterality: Bilateral;   NASAL SEPTOPLASTY W/ TURBINOPLASTY Bilateral 09/03/2020   Procedure: NASAL SEPTOPLASTY WITH TURBINATE REDUCTION;  Surgeon: Leta Baptist, MD;  Location: [place];  Service: ENT;  Laterality: Bilateral;    SOCIAL HISTORY: Social History   Socioeconomic History   Marital status: Married    Spouse name: Not on file   Number of children: Not on file   Years of education: Not on file   Highest education level: Not on file  Occupational History   Not on file  Tobacco Use   Smoking status: Never   Smokeless tobacco: Never  Vaping Use   Vaping Use: Never  used  Substance and Sexual Activity   Alcohol use: No   Drug use: No   Sexual activity: Yes    Birth control/protection: Surgical  Other Topics Concern   Not on file  Social History Narrative   Not on file   Social Determinants of Health   Financial Resource Strain: Not on file  Food Insecurity: Not on file  Transportation Needs: Not on file  Physical Activity: Not on file  Stress: Not on file  Social Connections: Not on file  Intimate Partner Violence: Not on file    FAMILY HISTORY: Family History  Problem Relation Age of Onset   Hypertension Mother    Cancer Mother        breast   Heart disease Father    Diabetes Father    Hypertension Maternal Aunt    Hypertension Maternal Uncle        x3   Diabetes Maternal Uncle        x3   Cancer Paternal Aunt        breast   Diabetes Paternal Aunt    Heart disease Paternal Uncle    Hypertension Maternal Grandmother    Cancer Maternal Grandmother        ovarian   Diabetes Maternal Grandmother    Heart disease Maternal Grandfather    Hypertension Maternal Grandfather    Diabetes Maternal Grandfather    Cancer Paternal Grandfather        liver   Hypertension Cousin    Anesthesia problems Neg  Hx    Hypotension Neg Hx    Malignant hyperthermia Neg Hx    Pseudochol deficiency Neg Hx     ALLERGIES:  is allergic to yasmin [drospirenone-ethinyl estradiol].  MEDICATIONS:  Current Outpatient Medications  Medication Sig Dispense Refill   nebivolol (BYSTOLIC) 2.5 MG tablet Take 2.5 mg by mouth daily.     omeprazole (PRILOSEC) 20 MG capsule Take 20 mg by mouth daily.     No current facility-administered medications for this visit.    REVIEW OF SYSTEMS:   Constitutional: ( - ) fevers, ( - )  chills , ( - ) night sweats Eyes: ( - ) blurriness of vision, ( - ) double vision, ( - ) watery eyes Ears, nose, mouth, throat, and face: ( - ) mucositis, ( - ) sore throat Respiratory: ( - ) cough, ( - ) dyspnea, ( - )  wheezes Cardiovascular: ( - ) palpitation, ( - ) chest discomfort, ( - ) lower extremity swelling Gastrointestinal:  ( - ) nausea, ( - ) heartburn, ( - ) change in bowel habits Skin: ( - ) abnormal skin rashes Lymphatics: ( - ) new lymphadenopathy, ( - ) easy bruising Neurological: ( - ) numbness, ( - ) tingling, ( - ) new weaknesses Behavioral/Psych: ( - ) mood change, ( - ) new changes  All other systems were reviewed with the patient and are negative.  PHYSICAL EXAMINATION: ECOG PERFORMANCE STATUS: {CHL ONC ECOG PS:[id number]}  There were no vitals filed for this visit. There were no vitals filed for this visit.  GENERAL: well appearing *** in NAD  SKIN: skin color, texture, turgor are normal, no rashes or significant lesions EYES: conjunctiva are pink and non-injected, sclera clear OROPHARYNX: no exudate, no erythema; lips, buccal mucosa, and tongue normal  NECK: supple, non-tender LYMPH:  no palpable lymphadenopathy in the cervical, axillary or supraclavicular lymph nodes.  LUNGS: clear to auscultation and percussion with normal breathing effort HEART: regular rate & rhythm and no murmurs and no lower extremity edema ABDOMEN: soft, non-tender, non-distended, normal bowel sounds Musculoskeletal: no cyanosis of digits and no clubbing  PSYCH: alert & oriented x 3, fluent speech NEURO: no focal motor/sensory deficits  LABORATORY DATA:  I have reviewed the data as listed    Latest Ref Rng & Units 07/18/2016    3:20 PM 08/24/2011    5:39 AM 08/22/2011    8:40 AM  CBC  WBC 4.0 - 10.5 K/uL 5.3  10.0  8.0   Hemoglobin 12.0 - 15.0 g/dL 14.1  9.2  12.4   Hematocrit 36.0 - 46.0 % 40.3  27.1  36.6   Platelets 150 - 400 K/uL 271  141  166         No data to display           PATHOLOGY: ***  BLOOD FILM: *** Review of the peripheral blood smear showed normal appearing white cells with neutrophils that were appropriately lobated and granulated. There was no predominance of  bi-lobed or hyper-segmented neutrophils appreciated. No Dohle bodies were noted. There was no left shifting, immature forms or blasts noted. Lymphocytes remain normal in size without any predominance of large granular lymphocytes. Red cells show no anisopoikilocytosis, macrocytes , microcytes or polychromasia. There were no schistocytes, target cells, echinocytes, acanthocytes, dacrocytes, or stomatocytes.There was no rouleaux formation, nucleated red cells, or intra-cellular inclusions noted. The platelets are normal in size, shape, and color without any clumping evident.  RADIOGRAPHIC STUDIES: I have personally reviewed  the radiological images as listed and agreed with the findings in the report. No results found.  ASSESSMENT & PLAN ***   The differentials for leukopenia include infectious etiology, nutritional etiology, inflammatory etiology, or medication induced.  The patient *** currently taking any medications known to cause neutropenia.  We will order viral studies with hep B, hep C, and HIV as well.  In addition, we will check for nutritional anemias with B12 and folate levels.       # Leukopenia  --Rule out infectious etiology with hepatitis B serologies, hepatitis C serologies, and HIV.  --Nutritional evaluation with  vitamin B12, MMA and folate.  --Inflammatory evaluation with ESR and CRP.   --Repeat CBC and CMP.  Additionally, we will order peripheral blood film.  --Assuming there are no concerning findings on the blood work-up today. we will plan to see the patient back in approximately 6 months time.   No orders of the defined types were placed in this encounter.   All questions were answered. The patient knows to call the clinic with any problems, questions or concerns.  I have spent a total of {CHL ONC TIME VISIT - KGMWN:[id number]} minutes of face-to-face and non-face-to-face time, preparing to see the patient, obtaining and/or reviewing separately obtained history,  performing a medically appropriate examination, counseling and educating the patient, ordering medications/tests/procedures, referring and communicating with other health care professionals, documenting clinical information in the electronic health record, independently interpreting results and communicating results to the patient, and care coordination.   Georga Kaufmann, PA-C Department of Hematology/Oncology [facility] Phone: [phone]

## 2022-11-25 ENCOUNTER — Inpatient Hospital Stay: Payer: BC Managed Care – PPO

## 2022-11-25 ENCOUNTER — Inpatient Hospital Stay: Payer: BC Managed Care – PPO | Attending: Physician Assistant | Admitting: Physician Assistant

## 2022-11-25 ENCOUNTER — Encounter: Payer: Self-pay | Admitting: Physician Assistant

## 2022-11-25 VITALS — BP 134/85 | HR 67 | Temp 97.5°F | Resp 13 | Wt 130.9 lb

## 2022-11-25 DIAGNOSIS — D649 Anemia, unspecified: Secondary | ICD-10-CM

## 2022-11-25 DIAGNOSIS — D72819 Decreased white blood cell count, unspecified: Secondary | ICD-10-CM | POA: Diagnosis not present

## 2022-11-25 DIAGNOSIS — I10 Essential (primary) hypertension: Secondary | ICD-10-CM | POA: Insufficient documentation

## 2022-11-25 DIAGNOSIS — Z809 Family history of malignant neoplasm, unspecified: Secondary | ICD-10-CM

## 2022-11-25 DIAGNOSIS — K219 Gastro-esophageal reflux disease without esophagitis: Secondary | ICD-10-CM | POA: Insufficient documentation

## 2022-11-25 LAB — CBC WITH DIFFERENTIAL (CANCER CENTER ONLY)
Abs Immature Granulocytes: 0 10*3/uL (ref 0.00–0.07)
Basophils Absolute: 0 10*3/uL (ref 0.0–0.1)
Basophils Relative: 1 %
Eosinophils Absolute: 0.1 10*3/uL (ref 0.0–0.5)
Eosinophils Relative: 1 %
HCT: 36.8 % (ref 36.0–46.0)
Hemoglobin: 12.9 g/dL (ref 12.0–15.0)
Immature Granulocytes: 0 %
Lymphocytes Relative: 28 %
Lymphs Abs: 1.2 10*3/uL (ref 0.7–4.0)
MCH: 33.3 pg (ref 26.0–34.0)
MCHC: 35.1 g/dL (ref 30.0–36.0)
MCV: 95.1 fL (ref 80.0–100.0)
Monocytes Absolute: 0.3 10*3/uL (ref 0.1–1.0)
Monocytes Relative: 6 %
Neutro Abs: 2.8 10*3/uL (ref 1.7–7.7)
Neutrophils Relative %: 64 %
Platelet Count: 221 10*3/uL (ref 150–400)
RBC: 3.87 MIL/uL (ref 3.87–5.11)
RDW: 12.9 % (ref 11.5–15.5)
WBC Count: 4.3 10*3/uL (ref 4.0–10.5)
nRBC: 0 % (ref 0.0–0.2)

## 2022-11-25 LAB — CMP (CANCER CENTER ONLY)
ALT: 15 U/L (ref 0–44)
AST: 14 U/L — ABNORMAL LOW (ref 15–41)
Albumin: 4.2 g/dL (ref 3.5–5.0)
Alkaline Phosphatase: 44 U/L (ref 38–126)
Anion gap: 5 (ref 5–15)
BUN: 12 mg/dL (ref 6–20)
CO2: 29 mmol/L (ref 22–32)
Calcium: 9.5 mg/dL (ref 8.9–10.3)
Chloride: 106 mmol/L (ref 98–111)
Creatinine: 0.72 mg/dL (ref 0.44–1.00)
GFR, Estimated: >60 mL/min (ref >60)
Glucose, Bld: 80 mg/dL (ref 70–99)
Potassium: 3.7 mmol/L (ref 3.5–5.1)
Sodium: 140 mmol/L (ref 135–145)
Total Bilirubin: 0.5 mg/dL (ref 0.3–1.2)
Total Protein: 6.7 g/dL (ref 6.5–8.1)

## 2022-11-25 LAB — TECHNOLOGIST SMEAR REVIEW

## 2022-11-25 LAB — FOLATE: Folate: 14.7 ng/mL (ref >5.9)

## 2022-11-25 LAB — HEPATITIS B SURFACE ANTIBODY,QUALITATIVE: Hep B S Ab: REACTIVE — AB

## 2022-11-25 LAB — HEPATITIS B SURFACE ANTIGEN: Hepatitis B Surface Ag: NONREACTIVE

## 2022-11-25 LAB — HEPATITIS C ANTIBODY: HCV Ab: NONREACTIVE

## 2022-11-25 LAB — IRON AND IRON BINDING CAPACITY (CC-WL,HP ONLY)
Iron: 79 ug/dL (ref 28–170)
Saturation Ratios: 20 % (ref 10.4–31.8)
TIBC: 395 ug/dL (ref 250–450)
UIBC: 316 ug/dL (ref 148–442)

## 2022-11-25 LAB — VITAMIN B12: Vitamin B-12: 503 pg/mL (ref 180–914)

## 2022-11-25 LAB — FERRITIN: Ferritin: 11 ng/mL (ref 11–307)

## 2022-11-25 LAB — HIV ANTIBODY (ROUTINE TESTING W REFLEX): HIV Screen 4th Generation wRfx: NONREACTIVE

## 2022-11-25 LAB — C-REACTIVE PROTEIN: CRP: 0.5 mg/dL (ref <1.0)

## 2022-11-25 LAB — SEDIMENTATION RATE: Sed Rate: 1 mm/hr (ref 0–22)

## 2022-11-25 LAB — HEPATITIS B CORE ANTIBODY, TOTAL: Hep B Core Total Ab: NONREACTIVE

## 2022-11-28 LAB — METHYLMALONIC ACID, SERUM: Methylmalonic Acid, Quantitative: 128 nmol/L (ref 0–378)

## 2022-12-01 ENCOUNTER — Telehealth: Payer: Self-pay | Admitting: Physician Assistant

## 2022-12-01 DIAGNOSIS — E611 Iron deficiency: Secondary | ICD-10-CM

## 2022-12-01 MED ORDER — FERROUS SULFATE 325 (65 FE) MG PO TBEC: 325.0000 mg | DELAYED_RELEASE_TABLET | Freq: Every day | ORAL | 6 refills | Status: DC

## 2022-12-01 NOTE — Telephone Encounter (Signed)
Contacted patient to scheduled appointments. Left message with appointment details and a call back number if patient had any questions or could not accommodate the time we provided.   

## 2022-12-01 NOTE — Telephone Encounter (Signed)
I called [name] to review the lab results from 11/25/22. Findings show that WBC has normalized that there is no other cytopenias. Remaining workup was negative except for iron deficiency. Recommend to start PO ferrous sulfate 325 mg once daily with a source of vitamin C. Discussed iron rich foods she can incorporate into diet.   Patient will return in 3 months for lab only check and 6 months for labs and follow up.   Anna Brady expressed understanding of the plan provided.

## 2023-01-22 ENCOUNTER — Encounter: Payer: Self-pay | Admitting: Genetic Counselor

## 2023-01-23 ENCOUNTER — Telehealth: Payer: Self-pay | Admitting: Genetic Counselor

## 2023-01-23 NOTE — Telephone Encounter (Signed)
We reviewed Anna Brady 2019 genetic test results. She had [facility] MyRisk testing and her result was Negative. 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   We discussed the option to pursue updated genetic testing for RNA analysis but that the chance of finding a gene mutation not identified on her prior testing is <1%. She stated that her mom did test positive for a gene associated with skin cancer and another cancer. We reviewed the importance of obtaining her mother's results and sharing those with [place] so that we can ensure she was tested for the gene. After discussion, she chose not to pursue updated testing at this time.   Anna Brady is currently having high risk breast cancer screening due to a >20% lifetime risk for breast cancer. Specifically, her risk is 31.2%.   We recommended she reach out to our office every year or two to see if we would recommend any additional testing in the future.   Lucille Passy, MS, [facility] Genetic Counselor [email or web address] (P) [phone]

## 2023-01-26 ENCOUNTER — Inpatient Hospital Stay: Payer: BC Managed Care – PPO | Admitting: Genetic Counselor

## 2023-01-26 ENCOUNTER — Inpatient Hospital Stay: Payer: BC Managed Care – PPO

## 2023-03-02 ENCOUNTER — Ambulatory Visit
Admission: RE | Admit: 2023-03-02 | Discharge: 2023-03-02 | Disposition: A | Discharge status: Home / Self Care | Payer: BC Managed Care – PPO | Source: Ambulatory Visit | Attending: Obstetrics and Gynecology | Admitting: Obstetrics and Gynecology

## 2023-03-02 ENCOUNTER — Telehealth: Payer: Self-pay | Admitting: Physician Assistant

## 2023-03-02 ENCOUNTER — Other Ambulatory Visit: Payer: Self-pay

## 2023-03-02 ENCOUNTER — Inpatient Hospital Stay: Payer: BC Managed Care – PPO | Attending: Physician Assistant

## 2023-03-02 DIAGNOSIS — D649 Anemia, unspecified: Secondary | ICD-10-CM | POA: Insufficient documentation

## 2023-03-02 DIAGNOSIS — Z803 Family history of malignant neoplasm of breast: Secondary | ICD-10-CM

## 2023-03-02 DIAGNOSIS — D72819 Decreased white blood cell count, unspecified: Secondary | ICD-10-CM | POA: Diagnosis present

## 2023-03-02 DIAGNOSIS — E611 Iron deficiency: Secondary | ICD-10-CM

## 2023-03-02 LAB — IRON AND IRON BINDING CAPACITY (CC-WL,HP ONLY)
Iron: 95 ug/dL (ref 28–170)
Saturation Ratios: 24 % (ref 10.4–31.8)
TIBC: 403 ug/dL (ref 250–450)
UIBC: 308 ug/dL (ref 148–442)

## 2023-03-02 LAB — CBC WITH DIFFERENTIAL (CANCER CENTER ONLY)
Abs Immature Granulocytes: 0 10*3/uL (ref 0.00–0.07)
Basophils Absolute: 0 10*3/uL (ref 0.0–0.1)
Basophils Relative: 1 %
Eosinophils Absolute: 0.1 10*3/uL (ref 0.0–0.5)
Eosinophils Relative: 2 %
HCT: 38.5 % (ref 36.0–46.0)
Hemoglobin: 13.1 g/dL (ref 12.0–15.0)
Immature Granulocytes: 0 %
Lymphocytes Relative: 31 %
Lymphs Abs: 1.2 10*3/uL (ref 0.7–4.0)
MCH: 33.1 pg (ref 26.0–34.0)
MCHC: 34 g/dL (ref 30.0–36.0)
MCV: 97.2 fL (ref 80.0–100.0)
Monocytes Absolute: 0.2 10*3/uL (ref 0.1–1.0)
Monocytes Relative: 6 %
Neutro Abs: 2.3 10*3/uL (ref 1.7–7.7)
Neutrophils Relative %: 60 %
Platelet Count: 224 10*3/uL (ref 150–400)
RBC: 3.96 MIL/uL (ref 3.87–5.11)
RDW: 13.1 % (ref 11.5–15.5)
WBC Count: 3.8 10*3/uL — ABNORMAL LOW (ref 4.0–10.5)
nRBC: 0 % (ref 0.0–0.2)

## 2023-03-02 LAB — CMP (CANCER CENTER ONLY)
ALT: 19 U/L (ref 0–44)
AST: 15 U/L (ref 15–41)
Albumin: 4.6 g/dL (ref 3.5–5.0)
Alkaline Phosphatase: 43 U/L (ref 38–126)
Anion gap: 5 (ref 5–15)
BUN: 9 mg/dL (ref 6–20)
CO2: 31 mmol/L (ref 22–32)
Calcium: 9.3 mg/dL (ref 8.9–10.3)
Chloride: 104 mmol/L (ref 98–111)
Creatinine: 0.76 mg/dL (ref 0.44–1.00)
GFR, Estimated: >60 mL/min (ref >60)
Glucose, Bld: 63 mg/dL — ABNORMAL LOW (ref 70–99)
Potassium: 3.7 mmol/L (ref 3.5–5.1)
Sodium: 140 mmol/L (ref 135–145)
Total Bilirubin: 0.6 mg/dL (ref 0.3–1.2)
Total Protein: 6.8 g/dL (ref 6.5–8.1)

## 2023-03-02 LAB — FERRITIN: Ferritin: 14 ng/mL (ref 11–307)

## 2023-03-02 MED ORDER — GADOPICLENOL 0.5 MMOL/ML IV SOLN
5.0000 mL | Freq: Once | INTRAVENOUS | Status: AC | PRN
  Administered 2023-03-02: 5 mL via INTRAVENOUS

## 2023-03-02 NOTE — Telephone Encounter (Signed)
I reviewed labs from today. There is mild leukopenia with WBC 3.8. No other cytopenias. Iron levels are still low with a ferritin of 14. Patient is compliant with ferrous sulfate 325 mg once daily. She does have monthly menstrual cycles that last 5 days with 1 day of heavy bleeding.   I recommend IV iron to bolster her levels to prevent anemia. She expressed understanding of the plan provided.

## 2023-03-05 ENCOUNTER — Telehealth: Payer: Self-pay | Admitting: Pharmacy Technician

## 2023-03-05 NOTE — Telephone Encounter (Addendum)
[place] note:  Patient will be scheduled as soon as possible  Auth Submission: NO AUTH NEEDED Site of care: Site of care: [facility] Payer: BCBS [place] Medication & CPT/J Code(s) submitted: Feraheme (ferumoxytol) [phone] Route of submission (phone, fax, portal):  Phone # Fax # Auth type: Buy/Bill Units/visits requested: 1 Reference number: [id number] Approval from: 03/05/23 to 07/06/23

## 2023-03-13 ENCOUNTER — Ambulatory Visit (INDEPENDENT_AMBULATORY_CARE_PROVIDER_SITE_OTHER): Payer: BC Managed Care – PPO | Admitting: *Deleted

## 2023-03-13 VITALS — BP 105/68 | HR 76 | Temp 98.6°F | Resp 20 | Ht 67.0 in | Wt 129.8 lb

## 2023-03-13 DIAGNOSIS — E611 Iron deficiency: Secondary | ICD-10-CM

## 2023-03-13 MED ORDER — ACETAMINOPHEN 325 MG PO TABS
650.0000 mg | ORAL_TABLET | Freq: Once | ORAL | Status: AC
  Administered 2023-03-13: 650 mg via ORAL
  Filled 2023-03-13: qty 2

## 2023-03-13 MED ORDER — DIPHENHYDRAMINE HCL 25 MG PO CAPS
25.0000 mg | ORAL_CAPSULE | Freq: Once | ORAL | Status: AC
  Administered 2023-03-13: 25 mg via ORAL
  Filled 2023-03-13: qty 1

## 2023-03-13 MED ORDER — SODIUM CHLORIDE 0.9 % IV SOLN
510.0000 mg | Freq: Once | INTRAVENOUS | Status: AC
  Administered 2023-03-13: 510 mg via INTRAVENOUS
  Filled 2023-03-13: qty 17

## 2023-03-13 NOTE — Progress Notes (Signed)
Diagnosis: Iron Deficiency Anemia  Provider:  Chilton Greathouse MD  Procedure: IV Infusion  IV Type: Peripheral, IV Location: L Antecubital  Feraheme (Ferumoxytol), Dose: 510 mg  Infusion Start Time: 1432 pm  Infusion Stop Time: 1449  pm  Post Infusion IV Care: Observation period completed and Peripheral IV Discontinued  Discharge: Condition: Good, Destination: Home . AVS Provided  Performed by:  Forrest Moron, RN

## 2023-06-01 ENCOUNTER — Other Ambulatory Visit: Payer: Self-pay | Admitting: Physician Assistant

## 2023-06-01 DIAGNOSIS — E611 Iron deficiency: Secondary | ICD-10-CM

## 2023-06-02 ENCOUNTER — Inpatient Hospital Stay: Payer: BC Managed Care – PPO | Admitting: Physician Assistant

## 2023-06-02 ENCOUNTER — Other Ambulatory Visit: Payer: Self-pay

## 2023-06-02 ENCOUNTER — Inpatient Hospital Stay: Payer: BC Managed Care – PPO | Attending: Physician Assistant

## 2023-06-02 ENCOUNTER — Telehealth: Payer: Self-pay

## 2023-06-02 VITALS — BP 124/75 | HR 64 | Temp 97.7°F | Resp 16 | Wt 132.4 lb

## 2023-06-02 DIAGNOSIS — E611 Iron deficiency: Secondary | ICD-10-CM

## 2023-06-02 DIAGNOSIS — D72819 Decreased white blood cell count, unspecified: Secondary | ICD-10-CM

## 2023-06-02 LAB — CBC WITH DIFFERENTIAL (CANCER CENTER ONLY)
Abs Immature Granulocytes: 0 10*3/uL (ref 0.00–0.07)
Basophils Absolute: 0 10*3/uL (ref 0.0–0.1)
Basophils Relative: 1 %
Eosinophils Absolute: 0.1 10*3/uL (ref 0.0–0.5)
Eosinophils Relative: 2 %
HCT: 38.7 % (ref 36.0–46.0)
Hemoglobin: 13.3 g/dL (ref 12.0–15.0)
Immature Granulocytes: 0 %
Lymphocytes Relative: 34 %
Lymphs Abs: 1 10*3/uL (ref 0.7–4.0)
MCH: 33.3 pg (ref 26.0–34.0)
MCHC: 34.4 g/dL (ref 30.0–36.0)
MCV: 96.8 fL (ref 80.0–100.0)
Monocytes Absolute: 0.2 10*3/uL (ref 0.1–1.0)
Monocytes Relative: 8 %
Neutro Abs: 1.6 10*3/uL — ABNORMAL LOW (ref 1.7–7.7)
Neutrophils Relative %: 55 %
Platelet Count: 216 10*3/uL (ref 150–400)
RBC: 4 MIL/uL (ref 3.87–5.11)
RDW: 12.6 % (ref 11.5–15.5)
WBC Count: 3 10*3/uL — ABNORMAL LOW (ref 4.0–10.5)
nRBC: 0 % (ref 0.0–0.2)

## 2023-06-02 LAB — CMP (CANCER CENTER ONLY)
ALT: 10 U/L (ref 0–44)
AST: 12 U/L — ABNORMAL LOW (ref 15–41)
Albumin: 4.6 g/dL (ref 3.5–5.0)
Alkaline Phosphatase: 44 U/L (ref 38–126)
Anion gap: 6 (ref 5–15)
BUN: 10 mg/dL (ref 6–20)
CO2: 28 mmol/L (ref 22–32)
Calcium: 9.3 mg/dL (ref 8.9–10.3)
Chloride: 105 mmol/L (ref 98–111)
Creatinine: 0.76 mg/dL (ref 0.44–1.00)
GFR, Estimated: >60 mL/min (ref >60)
Glucose, Bld: 85 mg/dL (ref 70–99)
Potassium: 3.8 mmol/L (ref 3.5–5.1)
Sodium: 139 mmol/L (ref 135–145)
Total Bilirubin: 0.6 mg/dL (ref 0.3–1.2)
Total Protein: 7 g/dL (ref 6.5–8.1)

## 2023-06-02 LAB — IRON AND IRON BINDING CAPACITY (CC-WL,HP ONLY)
Iron: 129 ug/dL (ref 28–170)
Saturation Ratios: 41 % — ABNORMAL HIGH (ref 10.4–31.8)
TIBC: 315 ug/dL (ref 250–450)
UIBC: 186 ug/dL (ref 148–442)

## 2023-06-02 LAB — FERRITIN: Ferritin: 87 ng/mL (ref 11–307)

## 2023-06-02 NOTE — Telephone Encounter (Signed)
-----   Message from Briant Cedar sent at 06/02/2023  2:47 PM EDT ----- Please notify patient that iron levels are in normal range. No need for additional IV iron at this time. ----- Message ----- From: Leory Plowman, Lab In [place] Sent: 06/02/2023   9:54 AM EDT To: Briant Cedar, PA-C

## 2023-06-02 NOTE — Telephone Encounter (Signed)
LM for pt with lab results and no IV iron needed

## 2023-06-02 NOTE — Progress Notes (Signed)
[facility] Telephone:([phone]) [phone]   Fax:(336) [phone]  PROGRESS NOTE  Patient Care Team: Thana Ates, MD as PCP - General (Internal Medicine)  Hematological/Oncological History 10/02/2022: Labs from PCP, Dr. Hillard Danker [facility]): WBC 3.6 (L), Hgb 11.9 (L), MCV 96.1, Plt 210. 11/25/2022: Establish care with [facility] 03/13/2023: IV feraheme 510 mg x 1 dose.   CHIEF COMPLAINTS/PURPOSE OF CONSULTATION:  -Leukopenia/neutropenia -Iron deficiency  HISTORY OF PRESENTING ILLNESS:  Anna 42 y.o. female returns for a follow up for leukopenia/neutropenia and iron deficiency without anemia. She was last seen on 11/25/2022 to establish care. In the interim, she has continued on PO iron therapy and received IV feraheme x 1 dose   On exam today, Anna Brady reports her energy levels did improve after receiving IV iron infusion. She continues to take PO iron therapy as prescribed. She continues to have regular menstrual cycles lasting 5-6 days with 2 days of heavy bleeding. She denies any other signs of bleeding. She reports her appetite is stable without any weight loss. She denies nausea, vomiting or bowel habit changes. She denies fevers, chills, sweats, shortness of breath, chest pain or cough. She has no other complaints. Rest of the ROS is below.     MEDICAL HISTORY:  Past Medical History:  Diagnosis Date   GERD (gastroesophageal reflux disease)    diet controlled   Hypertension    IBS (irritable bowel syndrome)    IBS (irritable bowel syndrome)    PONV (postoperative nausea and vomiting)    Reflux     SURGICAL HISTORY: Past Surgical History:  Procedure Laterality Date   CESAREAN SECTION  08/23/2011   Procedure: CESAREAN SECTION;  Surgeon: Almon Hercules;  Location: [facility];  Service: Gynecology;  Laterality: N/A;  primary of baby  girl at 0302  [place] 4/9, cord ph 7.24   LAPAROSCOPIC BILATERAL SALPINGECTOMY Bilateral 07/25/2016    Procedure: LAPAROSCOPIC BILATERAL SALPINGECTOMY;  Surgeon: Waynard Reeds, MD;  Location: [facility];  Service: Gynecology;  Laterality: Bilateral;   NASAL SEPTOPLASTY W/ TURBINOPLASTY Bilateral 09/03/2020   Procedure: NASAL SEPTOPLASTY WITH TURBINATE REDUCTION;  Surgeon: Newman Pies, MD;  Location: [facility];  Service: ENT;  Laterality: Bilateral;    SOCIAL HISTORY: Social History   Socioeconomic History   Marital status: Married    Spouse name: Not on file   Number of children: Not on file   Years of education: Not on file   Highest education level: Not on file  Occupational History   Not on file  Tobacco Use   Smoking status: Never   Smokeless tobacco: Never  Vaping Use   Vaping status: Never Used  Substance and Sexual Activity   Alcohol use: No   Drug use: No   Sexual activity: Yes    Birth control/protection: Surgical  Other Topics Concern   Not on file  Social History Narrative   Not on file   Social Determinants of Health   Financial Resource Strain: Not on file  Food Insecurity: No Food Insecurity (11/25/2022)   Hunger Vital Sign    Worried About Running Out of Food in the Last Year: Never true    Ran Out of Food in the Last Year: Never true  Transportation Needs: No Transportation Needs (11/25/2022)   PRAPARE - Administrator, Civil Service (Medical): No    Lack of Transportation (Non-Medical): No  Physical Activity: Not on file  Stress: Not on file  Social  Connections: Socially Integrated (11/25/2022)   Social Connection and Isolation Panel [NHANES]    Frequency of Communication with Friends and Family: More than three times a week    Frequency of Social Gatherings with Friends and Family: Twice a week    Attends Religious Services: 1 to 4 times per year    Active Member of [employer] or Organizations: Yes    Attends Banker Meetings: 1 to 4 times per year    Marital Status: Married  Recent Concern: Social Connections - Socially  Isolated (11/25/2022)   Social Connection and Isolation Panel [NHANES]    Frequency of Communication with Friends and Family: Never    Frequency of Social Gatherings with Friends and Family: Never    Attends Religious Services: Never    Database administrator or Organizations: No    Attends Banker Meetings: Never    Marital Status: Married  Catering manager Violence: Not At Risk (11/25/2022)   Humiliation, Afraid, Rape, and Kick questionnaire    Fear of Current or Ex-Partner: No    Emotionally Abused: No    Physically Abused: No    Sexually Abused: No    FAMILY HISTORY: Family History  Problem Relation Age of Onset   Hypertension Mother    Breast cancer Mother 79   Heart disease Father    Diabetes Father    Hypertension Maternal Grandmother    Ovarian cancer Maternal Grandmother    Diabetes Maternal Grandmother    Heart disease Maternal Grandfather    Hypertension Maternal Grandfather    Diabetes Maternal Grandfather    Cancer Paternal Grandfather        liver   Hypertension Maternal Aunt    Hypertension Maternal Uncle        x3   Diabetes Maternal Uncle        x3   Breast cancer Paternal Aunt    Diabetes Paternal Aunt    Heart disease Paternal Uncle    Hypertension Cousin    Anesthesia problems Neg Hx    Hypotension Neg Hx    Malignant hyperthermia Neg Hx    Pseudochol deficiency Neg Hx     ALLERGIES:  is allergic to tape and yasmin [drospirenone-ethinyl estradiol].  MEDICATIONS:  Current Outpatient Medications  Medication Sig Dispense Refill   Ascorbic Acid (VITAMIN C GUMMIES PO) Take 282 mg by mouth daily.     ferrous sulfate 325 (65 FE) MG EC tablet Take 1 tablet (325 mg total) by mouth daily with breakfast. 30 tablet 6   nebivolol (BYSTOLIC) 5 MG tablet Take 5 mg by mouth daily.     No current facility-administered medications for this visit.    REVIEW OF SYSTEMS:   Constitutional: ( - ) fevers, ( - )  chills , ( - ) night sweats Eyes: ( -  ) blurriness of vision, ( - ) double vision, ( - ) watery eyes Ears, nose, mouth, throat, and face: ( - ) mucositis, ( - ) sore throat Respiratory: ( - ) cough, ( - ) dyspnea, ( - ) wheezes Cardiovascular: ( - ) palpitation, ( - ) chest discomfort, ( - ) lower extremity swelling Gastrointestinal:  ( - ) nausea, ( - ) heartburn, ( - ) change in bowel habits Skin: ( - ) abnormal skin rashes Lymphatics: ( - ) new lymphadenopathy, ( - ) easy bruising Neurological: ( - ) numbness, ( - ) tingling, ( - ) new weaknesses Behavioral/Psych: ( - ) mood change, ( - )  new changes  All other systems were reviewed with the patient and are negative.  PHYSICAL EXAMINATION: ECOG PERFORMANCE STATUS: 0 - Asymptomatic  Vitals:   06/02/23 1014  BP: 124/75  Pulse: 64  Resp: 16  Temp: 97.7 F (36.5 C)  SpO2: 100%   Filed Weights   06/02/23 1014  Weight: 132 lb 6.4 oz (60.1 kg)    GENERAL: well appearing female in NAD  SKIN: skin color, texture, turgor are normal, no rashes or significant lesions EYES: conjunctiva are pink and non-injected, sclera clear LUNGS: clear to auscultation and percussion with normal breathing effort HEART: regular rate & rhythm and no murmurs and no lower extremity edema Musculoskeletal: no cyanosis of digits and no clubbing  PSYCH: alert & oriented x 3, fluent speech NEURO: no focal motor/sensory deficits  LABORATORY DATA:  I have reviewed the data as listed    Latest Ref Rng & Units 06/02/2023    9:46 AM 03/02/2023    9:19 AM 11/25/2022    9:59 AM  CBC  WBC 4.0 - 10.5 K/uL 3.0  3.8  4.3   Hemoglobin 12.0 - 15.0 g/dL 16.1  09.6  04.5   Hematocrit 36.0 - 46.0 % 38.7  38.5  36.8   Platelets 150 - 400 K/uL 216  224  221        Latest Ref Rng & Units 06/02/2023    9:46 AM 03/02/2023    9:19 AM 11/25/2022    9:59 AM  CMP  Glucose 70 - 99 mg/dL 85  63  80   BUN 6 - 20 mg/dL 10  9  12    Creatinine 0.44 - 1.00 mg/dL 4.09  8.11  9.14   Sodium 135 - 145 mmol/L 139  140  140    Potassium 3.5 - 5.1 mmol/L 3.8  3.7  3.7   Chloride 98 - 111 mmol/L 105  104  106   CO2 22 - 32 mmol/L 28  31  29    Calcium 8.9 - 10.3 mg/dL 9.3  9.3  9.5   Total Protein 6.5 - 8.1 g/dL 7.0  6.8  6.7   Total Bilirubin 0.3 - 1.2 mg/dL 0.6  0.6  0.5   Alkaline Phos 38 - 126 U/L 44  43  44   AST 15 - 41 U/L 12  15  14    ALT 0 - 44 U/L 10  19  15      ASSESSMENT & PLAN Anna Brady is a 42 y.o. female who presents to the hematology clinic for a follow up for leukopenia/neutropenia and iron deficiency.     # Leukopenia/Neutropenia.  --Workup from 11/25/2022 ruled out hepatitis B/C, HIV, inflammatory process and nutritional deficiencies --Labs today show decrease in counts with WBC 3.0, ANC 1.6.  --No recurrent infections or fevers.  --Okay to take multivitamin and/or B complex --Continue to monitor at this time.   #Iron deficiency without anemia: --Secondary to menorrhagia.  --Received IV feraheme 510 mg x 1 dose on 03/13/2023.  --Iron panel shows serum iron 129, saturation 41%, ferritin 87.  --No need for additional IV iron at this time.  --Continue on ferrous sulfate 325 mg once daily with a source of vitamin C.   #Strong family history of cancer: --Patient underwent genetic testing in 2019 through gynecologist --Underwent MR breast on 03/02/2023 without any evidence of malignancy.  --Patient was evaluated by genetic counselor on 01/23/2023 and decided not to pursue updated testing. She is aware to follow up with  genetics team every 1-2 years for any recommendations on additional testing in the future.   Follow up: --3 months: labs only --6 months: labs and follow up  Orders Placed This Encounter  Procedures   CBC with Differential (Cancer Center Only)    Standing Status:   Standing    Number of Occurrences:   2    Standing Expiration Date:   06/01/2024   Iron and Iron Binding Capacity ([facility] only)    Standing Status:   Standing    Number of Occurrences:   2    Standing  Expiration Date:   06/01/2024   Ferritin    Standing Status:   Standing    Number of Occurrences:   2    Standing Expiration Date:   06/01/2024   CMP (Cancer Center only)    Standing Status:   Standing    Number of Occurrences:   2    Standing Expiration Date:   06/01/2024    All questions were answered. The patient knows to call the clinic with any problems, questions or concerns.  I have spent a total of 25 minutes minutes of face-to-face and non-face-to-face time, preparing to see the patient, performing a medically appropriate examination, counseling and educating the patient, documenting clinical information in the electronic health record,and care coordination.   Georga Kaufmann, PA-C Department of Hematology/Oncology [facility] at [facility] Phone: [phone]

## 2023-06-23 ENCOUNTER — Other Ambulatory Visit: Payer: Self-pay | Admitting: Physician Assistant

## 2023-08-31 ENCOUNTER — Other Ambulatory Visit: Payer: BC Managed Care – PPO

## 2023-08-31 ENCOUNTER — Inpatient Hospital Stay: Payer: BC Managed Care – PPO | Attending: Physician Assistant

## 2023-08-31 DIAGNOSIS — E611 Iron deficiency: Secondary | ICD-10-CM | POA: Diagnosis present

## 2023-08-31 DIAGNOSIS — D72819 Decreased white blood cell count, unspecified: Secondary | ICD-10-CM | POA: Diagnosis present

## 2023-08-31 LAB — CBC WITH DIFFERENTIAL (CANCER CENTER ONLY)
Abs Immature Granulocytes: 0.01 10*3/uL (ref 0.00–0.07)
Basophils Absolute: 0 10*3/uL (ref 0.0–0.1)
Basophils Relative: 0 %
Eosinophils Absolute: 0.1 10*3/uL (ref 0.0–0.5)
Eosinophils Relative: 2 %
HCT: 38.1 % (ref 36.0–46.0)
Hemoglobin: 12.8 g/dL (ref 12.0–15.0)
Immature Granulocytes: 0 %
Lymphocytes Relative: 31 %
Lymphs Abs: 1.5 10*3/uL (ref 0.7–4.0)
MCH: 32.7 pg (ref 26.0–34.0)
MCHC: 33.6 g/dL (ref 30.0–36.0)
MCV: 97.2 fL (ref 80.0–100.0)
Monocytes Absolute: 0.4 10*3/uL (ref 0.1–1.0)
Monocytes Relative: 8 %
Neutro Abs: 2.9 10*3/uL (ref 1.7–7.7)
Neutrophils Relative %: 59 %
Platelet Count: 224 10*3/uL (ref 150–400)
RBC: 3.92 MIL/uL (ref 3.87–5.11)
RDW: 12.6 % (ref 11.5–15.5)
WBC Count: 4.8 10*3/uL (ref 4.0–10.5)
nRBC: 0 % (ref 0.0–0.2)

## 2023-08-31 LAB — IRON AND IRON BINDING CAPACITY (CC-WL,HP ONLY)
Iron: 114 ug/dL (ref 28–170)
Saturation Ratios: 34 % — ABNORMAL HIGH (ref 10.4–31.8)
TIBC: 337 ug/dL (ref 250–450)
UIBC: 223 ug/dL (ref 148–442)

## 2023-08-31 LAB — CMP (CANCER CENTER ONLY)
ALT: 12 U/L (ref 0–44)
AST: 11 U/L — ABNORMAL LOW (ref 15–41)
Albumin: 4.5 g/dL (ref 3.5–5.0)
Alkaline Phosphatase: 43 U/L (ref 38–126)
Anion gap: 4 — ABNORMAL LOW (ref 5–15)
BUN: 17 mg/dL (ref 6–20)
CO2: 29 mmol/L (ref 22–32)
Calcium: 9.4 mg/dL (ref 8.9–10.3)
Chloride: 104 mmol/L (ref 98–111)
Creatinine: 0.79 mg/dL (ref 0.44–1.00)
GFR, Estimated: >60 mL/min (ref >60)
Glucose, Bld: 88 mg/dL (ref 70–99)
Potassium: 3.9 mmol/L (ref 3.5–5.1)
Sodium: 137 mmol/L (ref 135–145)
Total Bilirubin: 0.5 mg/dL (ref <1.2)
Total Protein: 6.8 g/dL (ref 6.5–8.1)

## 2023-09-01 LAB — FERRITIN: Ferritin: 76 ng/mL (ref 11–307)

## 2023-09-23 ENCOUNTER — Other Ambulatory Visit: Payer: Self-pay | Admitting: Obstetrics and Gynecology

## 2023-09-23 DIAGNOSIS — R928 Other abnormal and inconclusive findings on diagnostic imaging of breast: Secondary | ICD-10-CM

## 2023-09-28 ENCOUNTER — Ambulatory Visit
Admission: RE | Admit: 2023-09-28 | Discharge: 2023-09-28 | Disposition: A | Discharge status: Home / Self Care | Payer: BC Managed Care – PPO | Source: Ambulatory Visit | Attending: Obstetrics and Gynecology | Admitting: Obstetrics and Gynecology

## 2023-09-28 DIAGNOSIS — R928 Other abnormal and inconclusive findings on diagnostic imaging of breast: Secondary | ICD-10-CM

## 2023-10-29 ENCOUNTER — Telehealth: Payer: Self-pay | Admitting: Hematology and Oncology

## 2023-10-29 NOTE — Telephone Encounter (Signed)
Rescheduled appointments per provider on-call. Patient is aware of the changes made to her upcoming appointments.

## 2023-11-30 ENCOUNTER — Other Ambulatory Visit: Payer: BC Managed Care – PPO

## 2023-11-30 ENCOUNTER — Ambulatory Visit: Payer: BC Managed Care – PPO | Admitting: Hematology and Oncology

## 2023-12-02 ENCOUNTER — Other Ambulatory Visit: Payer: BC Managed Care – PPO

## 2023-12-02 ENCOUNTER — Ambulatory Visit: Payer: BC Managed Care – PPO | Admitting: Hematology and Oncology

## 2023-12-14 ENCOUNTER — Inpatient Hospital Stay: Payer: 59 | Attending: Physician Assistant

## 2023-12-14 ENCOUNTER — Inpatient Hospital Stay (HOSPITAL_BASED_OUTPATIENT_CLINIC_OR_DEPARTMENT_OTHER): Payer: 59 | Admitting: Hematology and Oncology

## 2023-12-14 VITALS — BP 126/82 | HR 63 | Temp 98.0°F | Resp 18 | Ht 67.0 in | Wt 133.4 lb

## 2023-12-14 DIAGNOSIS — E611 Iron deficiency: Secondary | ICD-10-CM | POA: Insufficient documentation

## 2023-12-14 DIAGNOSIS — D72819 Decreased white blood cell count, unspecified: Secondary | ICD-10-CM | POA: Diagnosis not present

## 2023-12-14 DIAGNOSIS — Z79899 Other long term (current) drug therapy: Secondary | ICD-10-CM | POA: Insufficient documentation

## 2023-12-14 DIAGNOSIS — Z808 Family history of malignant neoplasm of other organs or systems: Secondary | ICD-10-CM | POA: Insufficient documentation

## 2023-12-14 DIAGNOSIS — D709 Neutropenia, unspecified: Secondary | ICD-10-CM | POA: Diagnosis present

## 2023-12-14 DIAGNOSIS — Z803 Family history of malignant neoplasm of breast: Secondary | ICD-10-CM | POA: Diagnosis not present

## 2023-12-14 DIAGNOSIS — Z8041 Family history of malignant neoplasm of ovary: Secondary | ICD-10-CM | POA: Insufficient documentation

## 2023-12-14 LAB — IRON AND IRON BINDING CAPACITY (CC-WL,HP ONLY)
Iron: 109 ug/dL (ref 28–170)
Saturation Ratios: 29 % (ref 10.4–31.8)
TIBC: 378 ug/dL (ref 250–450)
UIBC: 269 ug/dL (ref 148–442)

## 2023-12-14 LAB — CBC WITH DIFFERENTIAL (CANCER CENTER ONLY)
Abs Immature Granulocytes: 0.01 10*3/uL (ref 0.00–0.07)
Basophils Absolute: 0 10*3/uL (ref 0.0–0.1)
Basophils Relative: 1 %
Eosinophils Absolute: 0.1 10*3/uL (ref 0.0–0.5)
Eosinophils Relative: 1 %
HCT: 40.1 % (ref 36.0–46.0)
Hemoglobin: 13.4 g/dL (ref 12.0–15.0)
Immature Granulocytes: 0 %
Lymphocytes Relative: 30 %
Lymphs Abs: 1.4 10*3/uL (ref 0.7–4.0)
MCH: 32.7 pg (ref 26.0–34.0)
MCHC: 33.4 g/dL (ref 30.0–36.0)
MCV: 97.8 fL (ref 80.0–100.0)
Monocytes Absolute: 0.4 10*3/uL (ref 0.1–1.0)
Monocytes Relative: 8 %
Neutro Abs: 2.8 10*3/uL (ref 1.7–7.7)
Neutrophils Relative %: 60 %
Platelet Count: 234 10*3/uL (ref 150–400)
RBC: 4.1 MIL/uL (ref 3.87–5.11)
RDW: 12.8 % (ref 11.5–15.5)
WBC Count: 4.7 10*3/uL (ref 4.0–10.5)
nRBC: 0 % (ref 0.0–0.2)

## 2023-12-14 LAB — CMP (CANCER CENTER ONLY)
ALT: 15 U/L (ref 0–44)
AST: 12 U/L — ABNORMAL LOW (ref 15–41)
Albumin: 4.7 g/dL (ref 3.5–5.0)
Alkaline Phosphatase: 47 U/L (ref 38–126)
Anion gap: 4 — ABNORMAL LOW (ref 5–15)
BUN: 12 mg/dL (ref 6–20)
CO2: 32 mmol/L (ref 22–32)
Calcium: 9.6 mg/dL (ref 8.9–10.3)
Chloride: 103 mmol/L (ref 98–111)
Creatinine: 0.75 mg/dL (ref 0.44–1.00)
GFR, Estimated: >60 mL/min (ref >60)
Glucose, Bld: 74 mg/dL (ref 70–99)
Potassium: 3.7 mmol/L (ref 3.5–5.1)
Sodium: 139 mmol/L (ref 135–145)
Total Bilirubin: 0.6 mg/dL (ref 0.0–1.2)
Total Protein: 6.8 g/dL (ref 6.5–8.1)

## 2023-12-14 LAB — FERRITIN: Ferritin: 53 ng/mL (ref 11–307)

## 2023-12-14 NOTE — Progress Notes (Signed)
[facility] Telephone:(336) [phone]   Fax:(336) [phone]  PROGRESS NOTE  Patient Care Team: Thana Ates, MD as PCP - General (Internal Medicine)  Hematological/Oncological History 10/02/2022: Labs from PCP, Dr. Hillard [facility] Internal Medicine): WBC 3.6 (L), Hgb 11.9 (L), MCV 96.1, Plt 210. 11/25/2022: Establish care with [facility] Hematology 03/13/2023: IV feraheme 510 mg x 1 dose.   CHIEF COMPLAINTS/PURPOSE OF CONSULTATION:  -Leukopenia/neutropenia -Iron deficiency  HISTORY OF PRESENTING ILLNESS:  Anna Brady 43 y.o. female returns for a follow up for leukopenia/neutropenia and iron deficiency without anemia. She was last seen on 06/02/2023 to establish care. In the interim, she has continued on PO iron therapy.   On exam today, Anna Brady reports she has had a lot of changes in her health interim since her last visit.  She was having difficulty with urination and thought she had a yeast infection.  She reports he is taking B complex vitamins and thinks this may have been contributing some to her discomfort.  She notes that she is also developed a rash on her torso, back, front, arms and legs.  She reports that she is following with dermatology who then recommended she follow-up with allergy/immunology.  She reports she was taking prednisone and Xyzal.  She notes that she stopped most of her medications including her iron for period time.  She thinks that maybe there is a food or stress that is causing her skin to flare.  She reports that she only had a brief gap of the iron therapy but is now taking the pills again.  She reports her energy levels are good and she is not having any lightheadedness, dizziness, or shortness of breath.  She reports that her menstrual bleeding has been steady. She denies fevers, chills, sweats, shortness of breath, chest pain or cough. She has no other complaints. Rest of the ROS is below.     MEDICAL HISTORY:  Past Medical History:   Diagnosis Date   GERD (gastroesophageal reflux disease)    diet controlled   Hypertension    IBS (irritable bowel syndrome)    IBS (irritable bowel syndrome)    PONV (postoperative nausea and vomiting)    Reflux     SURGICAL HISTORY: Past Surgical History:  Procedure Laterality Date   CESAREAN SECTION  08/23/2011   Procedure: CESAREAN SECTION;  Surgeon: Almon Hercules;  Location: [facility];  Service: Gynecology;  Laterality: N/A;  primary of baby  girl at 0302  APGAR 4/9, cord ph 7.24   LAPAROSCOPIC BILATERAL SALPINGECTOMY Bilateral 07/25/2016   Procedure: LAPAROSCOPIC BILATERAL SALPINGECTOMY;  Surgeon: Waynard Reeds, MD;  Location: [facility];  Service: Gynecology;  Laterality: Bilateral;   NASAL SEPTOPLASTY W/ TURBINOPLASTY Bilateral 09/03/2020   Procedure: NASAL SEPTOPLASTY WITH TURBINATE REDUCTION;  Surgeon: Newman Pies, MD;  Location: [facility];  Service: ENT;  Laterality: Bilateral;    SOCIAL HISTORY: Social History   Socioeconomic History   Marital status: Married    Spouse name: Not on file   Number of children: Not on file   Years of education: Not on file   Highest education level: Not on file  Occupational History   Not on file  Tobacco Use   Smoking status: Never   Smokeless tobacco: Never  Vaping Use   Vaping status: Never Used  Substance and Sexual Activity   Alcohol use: No   Drug use: No   Sexual activity: Yes    Birth control/protection: Surgical  Other Topics  Concern   Not on file  Social History Narrative   Not on file   Social Drivers of Health   Financial Resource Strain: Not on file  Food Insecurity: No Food Insecurity (11/25/2022)   Hunger Vital Sign    Worried About Running Out of Food in the Last Year: Never true    Ran Out of Food in the Last Year: Never true  Transportation Needs: No Transportation Needs (11/25/2022)   PRAPARE - Administrator, Civil Service (Medical): No    Lack of Transportation (Non-Medical): No   Physical Activity: Not on file  Stress: Not on file  Social Connections: Socially Integrated (11/25/2022)   Social Connection and Isolation Panel [NHANES]    Frequency of Communication with Friends and Family: More than three times a week    Frequency of Social Gatherings with Friends and Family: Twice a week    Attends Religious Services: 1 to 4 times per year    Active Member of [employer] or Organizations: Yes    Attends Banker Meetings: 1 to 4 times per year    Marital Status: Married  Recent Concern: Social Connections - Socially Isolated (11/25/2022)   Social Connection and Isolation Panel [NHANES]    Frequency of Communication with Friends and Family: Never    Frequency of Social Gatherings with Friends and Family: Never    Attends Religious Services: Never    Database administrator or Organizations: No    Attends Banker Meetings: Never    Marital Status: Married  Catering manager Violence: Not At Risk (11/25/2022)   Humiliation, Afraid, Rape, and Kick questionnaire    Fear of Current or Ex-Partner: No    Emotionally Abused: No    Physically Abused: No    Sexually Abused: No    FAMILY HISTORY: Family History  Problem Relation Age of Onset   Hypertension Mother    Breast cancer Mother 80   Heart disease Father    Diabetes Father    Hypertension Maternal Grandmother    Ovarian cancer Maternal Grandmother    Diabetes Maternal Grandmother    Heart disease Maternal Grandfather    Hypertension Maternal Grandfather    Diabetes Maternal Grandfather    Cancer Paternal Grandfather        liver   Hypertension Maternal Aunt    Hypertension Maternal Uncle        x3   Diabetes Maternal Uncle        x3   Breast cancer Paternal Aunt    Diabetes Paternal Aunt    Heart disease Paternal Uncle    Hypertension Cousin    Anesthesia problems Neg Hx    Hypotension Neg Hx    Malignant hyperthermia Neg Hx    Pseudochol deficiency Neg Hx     ALLERGIES:  is  allergic to tape and yasmin [drospirenone-ethinyl estradiol].  MEDICATIONS:  Current Outpatient Medications  Medication Sig Dispense Refill   Ascorbic Acid (VITAMIN C GUMMIES PO) Take 282 mg by mouth daily.     ferrous sulfate 325 (65 FE) MG EC tablet TAKE 1 TABLET(325 MG) BY MOUTH DAILY WITH BREAKFAST 30 tablet 6   nebivolol (BYSTOLIC) 5 MG tablet Take 5 mg by mouth daily.     No current facility-administered medications for this visit.    REVIEW OF SYSTEMS:   Constitutional: ( - ) fevers, ( - )  chills , ( - ) night sweats Eyes: ( - ) blurriness of vision, ( - )  double vision, ( - ) watery eyes Ears, nose, mouth, throat, and face: ( - ) mucositis, ( - ) sore throat Respiratory: ( - ) cough, ( - ) dyspnea, ( - ) wheezes Cardiovascular: ( - ) palpitation, ( - ) chest discomfort, ( - ) lower extremity swelling Gastrointestinal:  ( - ) nausea, ( - ) heartburn, ( - ) change in bowel habits Skin: ( - ) abnormal skin rashes Lymphatics: ( - ) new lymphadenopathy, ( - ) easy bruising Neurological: ( - ) numbness, ( - ) tingling, ( - ) new weaknesses Behavioral/Psych: ( - ) mood change, ( - ) new changes  All other systems were reviewed with the patient and are negative.  PHYSICAL EXAMINATION: ECOG PERFORMANCE STATUS: 0 - Asymptomatic  Vitals:   12/14/23 1505  BP: 126/82  Pulse: 63  Resp: 18  Temp: 98 F (36.7 C)  SpO2: 99%    Filed Weights   12/14/23 1505  Weight: 133 lb 6.4 oz (60.5 kg)     GENERAL: well appearing female in NAD  SKIN: skin color, texture, turgor are normal, no rashes or significant lesions EYES: conjunctiva are pink and non-injected, sclera clear LUNGS: clear to auscultation and percussion with normal breathing effort HEART: regular rate & rhythm and no murmurs and no lower extremity edema Musculoskeletal: no cyanosis of digits and no clubbing  PSYCH: alert & oriented x 3, fluent speech NEURO: no focal motor/sensory deficits  LABORATORY DATA:  I have  reviewed the data as listed    Latest Ref Rng & Units 12/14/2023    2:44 PM 08/31/2023    3:50 PM 06/02/2023    9:46 AM  CBC  WBC 4.0 - 10.5 K/uL 4.7  4.8  3.0   Hemoglobin 12.0 - 15.0 g/dL 16.1  09.6  04.5   Hematocrit 36.0 - 46.0 % 40.1  38.1  38.7   Platelets 150 - 400 K/uL 234  224  216        Latest Ref Rng & Units 12/14/2023    2:44 PM 08/31/2023    3:50 PM 06/02/2023    9:46 AM  CMP  Glucose 70 - 99 mg/dL 74  88  85   BUN 6 - 20 mg/dL 12  17  10    Creatinine 0.44 - 1.00 mg/dL 4.09  8.11  9.14   Sodium 135 - 145 mmol/L 139  137  139   Potassium 3.5 - 5.1 mmol/L 3.7  3.9  3.8   Chloride 98 - 111 mmol/L 103  104  105   CO2 22 - 32 mmol/L 32  29  28   Calcium 8.9 - 10.3 mg/dL 9.6  9.4  9.3   Total Protein 6.5 - 8.1 g/dL 6.8  6.8  7.0   Total Bilirubin 0.0 - 1.2 mg/dL 0.6  0.5  0.6   Alkaline Phos 38 - 126 U/L 47  43  44   AST 15 - 41 U/L 12  11  12    ALT 0 - 44 U/L 15  12  10      ASSESSMENT & PLAN Anna Brady is a 43 y.o. female who presents to the hematology clinic for a follow up for leukopenia/neutropenia and iron deficiency.     # Leukopenia/Neutropenia.  --Workup from 11/25/2022 ruled out hepatitis B/C, HIV, inflammatory process and nutritional deficiencies --Labs today show decrease in counts with WBC 4.7, hemoglobin 13.4, MCV 97.8, platelets 234 --No recurrent infections or fevers.  --Okay to take  multivitamin and/or B complex --Continue to monitor at this time.   #Iron deficiency without anemia: --Secondary to menorrhagia.  --Received IV feraheme 510 mg x 1 dose on 03/13/2023.  --labs today show WBC 4.7, hemoglobin 13.4, MCV 97.8, platelets 234 --No need for additional IV iron at this time.  --Continue on ferrous sulfate 325 mg once daily with a source of vitamin C.   #Strong family history of cancer: --Patient underwent genetic testing in 2019 through gynecologist --Underwent MR breast on 03/02/2023 without any evidence of malignancy.  --Patient was  evaluated by genetic counselor on 01/23/2023 and decided not to pursue updated testing. She is aware to follow up with genetics team every 1-2 years for any recommendations on additional testing in the future.   Follow up: --3 months: labs only --6 months: labs and follow up with Karena Addison   No orders of the defined types were placed in this encounter.   All questions were answered. The patient knows to call the clinic with any problems, questions or concerns.  I have spent a total of 30 minutes minutes of face-to-face and non-face-to-face time, preparing to see the patient, performing a medically appropriate examination, counseling and educating the patient, documenting clinical information in the electronic health record,and care coordination.   Ulysees Barns, MD Department of Hematology/Oncology [facility] Phone: [phone] Pager: [phone] Email: [email or web address]

## 2024-03-01 ENCOUNTER — Encounter: Payer: Self-pay | Admitting: Obstetrics and Gynecology

## 2024-03-01 ENCOUNTER — Other Ambulatory Visit: Payer: Self-pay | Admitting: Obstetrics and Gynecology

## 2024-03-01 DIAGNOSIS — Z1231 Encounter for screening mammogram for malignant neoplasm of breast: Secondary | ICD-10-CM

## 2024-03-03 ENCOUNTER — Telehealth: Payer: Self-pay | Admitting: Physician Assistant

## 2024-03-14 ENCOUNTER — Other Ambulatory Visit: Payer: 59

## 2024-03-14 ENCOUNTER — Inpatient Hospital Stay: Attending: Physician Assistant

## 2024-03-14 ENCOUNTER — Other Ambulatory Visit: Payer: Self-pay | Admitting: Hematology and Oncology

## 2024-03-14 DIAGNOSIS — D709 Neutropenia, unspecified: Secondary | ICD-10-CM | POA: Diagnosis present

## 2024-03-14 DIAGNOSIS — E611 Iron deficiency: Secondary | ICD-10-CM

## 2024-03-14 LAB — CMP (CANCER CENTER ONLY)
ALT: 12 U/L (ref 0–44)
AST: 13 U/L — ABNORMAL LOW (ref 15–41)
Albumin: 4.6 g/dL (ref 3.5–5.0)
Alkaline Phosphatase: 44 U/L (ref 38–126)
Anion gap: 5 (ref 5–15)
BUN: 16 mg/dL (ref 6–20)
CO2: 28 mmol/L (ref 22–32)
Calcium: 9.4 mg/dL (ref 8.9–10.3)
Chloride: 104 mmol/L (ref 98–111)
Creatinine: 0.73 mg/dL (ref 0.44–1.00)
GFR, Estimated: >60 mL/min (ref >60)
Glucose, Bld: 89 mg/dL (ref 70–99)
Potassium: 3.6 mmol/L (ref 3.5–5.1)
Sodium: 137 mmol/L (ref 135–145)
Total Bilirubin: 0.5 mg/dL (ref 0.0–1.2)
Total Protein: 7 g/dL (ref 6.5–8.1)

## 2024-03-14 LAB — CBC WITH DIFFERENTIAL (CANCER CENTER ONLY)
Abs Immature Granulocytes: 0.01 10*3/uL (ref 0.00–0.07)
Basophils Absolute: 0 10*3/uL (ref 0.0–0.1)
Basophils Relative: 0 %
Eosinophils Absolute: 0.1 10*3/uL (ref 0.0–0.5)
Eosinophils Relative: 1 %
HCT: 36.2 % (ref 36.0–46.0)
Hemoglobin: 12.3 g/dL (ref 12.0–15.0)
Immature Granulocytes: 0 %
Lymphocytes Relative: 28 %
Lymphs Abs: 1.4 10*3/uL (ref 0.7–4.0)
MCH: 32.4 pg (ref 26.0–34.0)
MCHC: 34 g/dL (ref 30.0–36.0)
MCV: 95.3 fL (ref 80.0–100.0)
Monocytes Absolute: 0.3 10*3/uL (ref 0.1–1.0)
Monocytes Relative: 6 %
Neutro Abs: 3.2 10*3/uL (ref 1.7–7.7)
Neutrophils Relative %: 65 %
Platelet Count: 207 10*3/uL (ref 150–400)
RBC: 3.8 MIL/uL — ABNORMAL LOW (ref 3.87–5.11)
RDW: 12.6 % (ref 11.5–15.5)
WBC Count: 4.9 10*3/uL (ref 4.0–10.5)
nRBC: 0 % (ref 0.0–0.2)

## 2024-03-14 LAB — IRON AND IRON BINDING CAPACITY (CC-WL,HP ONLY)
Iron: 105 ug/dL (ref 28–170)
Saturation Ratios: 30 % (ref 10.4–31.8)
TIBC: 351 ug/dL (ref 250–450)
UIBC: 246 ug/dL (ref 148–442)

## 2024-03-15 LAB — FERRITIN: Ferritin: 101 ng/mL (ref 11–307)

## 2024-06-02 ENCOUNTER — Telehealth: Payer: Self-pay | Admitting: Hematology and Oncology

## 2024-06-14 ENCOUNTER — Other Ambulatory Visit: Payer: 59

## 2024-06-14 ENCOUNTER — Ambulatory Visit: Payer: 59 | Admitting: Physician Assistant

## 2024-06-30 ENCOUNTER — Inpatient Hospital Stay: Admitting: Hematology and Oncology

## 2024-06-30 ENCOUNTER — Inpatient Hospital Stay: Attending: Hematology and Oncology

## 2024-06-30 ENCOUNTER — Other Ambulatory Visit: Payer: Self-pay | Admitting: Hematology and Oncology

## 2024-06-30 VITALS — BP 120/78 | HR 60 | Temp 98.2°F | Resp 16 | Ht 67.0 in | Wt 135.7 lb

## 2024-06-30 DIAGNOSIS — E611 Iron deficiency: Secondary | ICD-10-CM

## 2024-06-30 DIAGNOSIS — N920 Excessive and frequent menstruation with regular cycle: Secondary | ICD-10-CM | POA: Insufficient documentation

## 2024-06-30 DIAGNOSIS — D709 Neutropenia, unspecified: Secondary | ICD-10-CM | POA: Diagnosis not present

## 2024-06-30 DIAGNOSIS — D649 Anemia, unspecified: Secondary | ICD-10-CM | POA: Diagnosis not present

## 2024-06-30 DIAGNOSIS — Z803 Family history of malignant neoplasm of breast: Secondary | ICD-10-CM | POA: Insufficient documentation

## 2024-06-30 DIAGNOSIS — Z8042 Family history of malignant neoplasm of prostate: Secondary | ICD-10-CM | POA: Diagnosis not present

## 2024-06-30 DIAGNOSIS — D72819 Decreased white blood cell count, unspecified: Secondary | ICD-10-CM

## 2024-06-30 LAB — CMP (CANCER CENTER ONLY)
ALT: 14 U/L (ref 0–44)
AST: 12 U/L — ABNORMAL LOW (ref 15–41)
Albumin: 4.6 g/dL (ref 3.5–5.0)
Alkaline Phosphatase: 43 U/L (ref 38–126)
Anion gap: 4 — ABNORMAL LOW (ref 5–15)
BUN: 14 mg/dL (ref 6–20)
CO2: 30 mmol/L (ref 22–32)
Calcium: 9.4 mg/dL (ref 8.9–10.3)
Chloride: 106 mmol/L (ref 98–111)
Creatinine: 0.74 mg/dL (ref 0.44–1.00)
GFR, Estimated: >60 mL/min (ref >60)
Glucose, Bld: 79 mg/dL (ref 70–99)
Potassium: 4.3 mmol/L (ref 3.5–5.1)
Sodium: 140 mmol/L (ref 135–145)
Total Bilirubin: 0.5 mg/dL (ref 0.0–1.2)
Total Protein: 7 g/dL (ref 6.5–8.1)

## 2024-06-30 LAB — CBC WITH DIFFERENTIAL (CANCER CENTER ONLY)
Abs Immature Granulocytes: 0.01 K/uL (ref 0.00–0.07)
Basophils Absolute: 0 K/uL (ref 0.0–0.1)
Basophils Relative: 1 %
Eosinophils Absolute: 0.1 K/uL (ref 0.0–0.5)
Eosinophils Relative: 2 %
HCT: 38.6 % (ref 36.0–46.0)
Hemoglobin: 13.1 g/dL (ref 12.0–15.0)
Immature Granulocytes: 0 %
Lymphocytes Relative: 30 %
Lymphs Abs: 1.3 K/uL (ref 0.7–4.0)
MCH: 32.6 pg (ref 26.0–34.0)
MCHC: 33.9 g/dL (ref 30.0–36.0)
MCV: 96 fL (ref 80.0–100.0)
Monocytes Absolute: 0.3 K/uL (ref 0.1–1.0)
Monocytes Relative: 7 %
Neutro Abs: 2.6 K/uL (ref 1.7–7.7)
Neutrophils Relative %: 60 %
Platelet Count: 224 K/uL (ref 150–400)
RBC: 4.02 MIL/uL (ref 3.87–5.11)
RDW: 13.2 % (ref 11.5–15.5)
WBC Count: 4.3 K/uL (ref 4.0–10.5)
nRBC: 0 % (ref 0.0–0.2)

## 2024-06-30 LAB — IRON AND IRON BINDING CAPACITY (CC-WL,HP ONLY)
Iron: 90 ug/dL (ref 28–170)
Saturation Ratios: 26 % (ref 10.4–31.8)
TIBC: 344 ug/dL (ref 250–450)
UIBC: 254 ug/dL (ref 148–442)

## 2024-06-30 LAB — RETIC PANEL
Immature Retic Fract: 4.9 % (ref 2.3–15.9)
RBC.: 4.1 MIL/uL (ref 3.87–5.11)
Retic Count, Absolute: 46.7 K/uL (ref 19.0–186.0)
Retic Ct Pct: 1.1 % (ref 0.4–3.1)
Reticulocyte Hemoglobin: 35.6 pg (ref >27.9)

## 2024-06-30 NOTE — Progress Notes (Signed)
 [facility] Telephone:(336) [phone]   Fax:(336) [phone]  PROGRESS NOTE  Patient Care Team: Dwight Trula SQUIBB, MD as PCP - General (Internal Medicine)  Hematological/Oncological History 10/02/2022: Labs from PCP, Dr. Trula Dwight [facility] Internal Medicine): WBC 3.6 (L), Hgb 11.9 (L), MCV 96.1, Plt 210. 11/25/2022: Establish care with [facility] Hematology 03/13/2023: IV feraheme 510 mg x 1 dose.   CHIEF COMPLAINTS/PURPOSE OF CONSULTATION:  -Leukopenia/neutropenia -Iron deficiency  HISTORY OF PRESENTING ILLNESS:  Anna Brady 43 y.o. female returns for a follow up for leukopenia/neutropenia and iron deficiency without anemia. She was last seen on 12/14/2023. In the interim, she has continued on PO iron therapy.   On exam today, Mrs. Anette reports she has been well overall in interim since her last visit 6 months ago.  She reports her energy levels have been normal.  She reports that she has been working quite a lot and that may be causing her to have slightly lower energy levels.  She reports that she is however feeling much better after having received her IV iron infusions.  She reports that she is currently taking her iron pills daily with a 500 mg vitamin C capsule.  She is avoiding calcium.  She reports she has had no trouble with bleeding, bruising, or dark stools.  She is doing her best to try to eat more iron rich food in the form of protein.  She has had no issues with lightheadedness, dizziness, or shortness of breath.  She reports that she has been having her typical menstrual cycles.  She notes that otherwise she is at her baseline level of health.  After discussion of options moving forward she notes that she would like to follow-up with us  on an as-needed basis and continue to have her levels checked with her primary care doctor.  MEDICAL HISTORY:  Past Medical History:  Diagnosis Date   GERD (gastroesophageal reflux disease)    diet controlled   Hypertension    IBS  (irritable bowel syndrome)    IBS (irritable bowel syndrome)    PONV (postoperative nausea and vomiting)    Reflux     SURGICAL HISTORY: Past Surgical History:  Procedure Laterality Date   CESAREAN SECTION  08/23/2011   Procedure: CESAREAN SECTION;  Surgeon: Marjorie HILARIO Gull;  Location: [facility];  Service: Gynecology;  Laterality: N/A;  primary of baby  girl at 0302  APGAR 4/9, cord ph 7.24   LAPAROSCOPIC BILATERAL SALPINGECTOMY Bilateral 07/25/2016   Procedure: LAPAROSCOPIC BILATERAL SALPINGECTOMY;  Surgeon: Marjorie Gull, MD;  Location: [facility];  Service: Gynecology;  Laterality: Bilateral;   NASAL SEPTOPLASTY W/ TURBINOPLASTY Bilateral 09/03/2020   Procedure: NASAL SEPTOPLASTY WITH TURBINATE REDUCTION;  Surgeon: Karis Clunes, MD;  Location: [facility];  Service: ENT;  Laterality: Bilateral;    SOCIAL HISTORY: Social History   Socioeconomic History   Marital status: Married    Spouse name: Not on file   Number of children: Not on file   Years of education: Not on file   Highest education level: Not on file  Occupational History   Not on file  Tobacco Use   Smoking status: Never   Smokeless tobacco: Never  Vaping Use   Vaping status: Never Used  Substance and Sexual Activity   Alcohol use: No   Drug use: No   Sexual activity: Yes    Birth control/protection: Surgical  Other Topics Concern   Not on file  Social History Narrative   Not on file  Social Drivers of Corporate investment banker Strain: Not on file  Food Insecurity: No Food Insecurity (11/25/2022)   Hunger Vital Sign    Worried About Running Out of Food in the Last Year: Never true    Ran Out of Food in the Last Year: Never true  Transportation Needs: No Transportation Needs (11/25/2022)   PRAPARE - Administrator, Civil Service (Medical): No    Lack of Transportation (Non-Medical): No  Physical Activity: Not on file  Stress: Not on file  Social Connections: Socially Integrated (11/25/2022)    Social Connection and Isolation Panel    Frequency of Communication with Friends and Family: More than three times a week    Frequency of Social Gatherings with Friends and Family: Twice a week    Attends Religious Services: 1 to 4 times per year    Active Member of [employer] or Organizations: Yes    Attends Banker Meetings: 1 to 4 times per year    Marital Status: Married  Recent Concern: Social Connections - Socially Isolated (11/25/2022)   Social Connection and Isolation Panel    Frequency of Communication with Friends and Family: Never    Frequency of Social Gatherings with Friends and Family: Never    Attends Religious Services: Never    Database administrator or Organizations: No    Attends Banker Meetings: Never    Marital Status: Married  Catering manager Violence: Not At Risk (11/25/2022)   Humiliation, Afraid, Rape, and Kick questionnaire    Fear of Current or Ex-Partner: No    Emotionally Abused: No    Physically Abused: No    Sexually Abused: No    FAMILY HISTORY: Family History  Problem Relation Age of Onset   Hypertension Mother    Breast cancer Mother 57   Heart disease Father    Diabetes Father    Hypertension Maternal Grandmother    Ovarian cancer Maternal Grandmother    Diabetes Maternal Grandmother    Heart disease Maternal Grandfather    Hypertension Maternal Grandfather    Diabetes Maternal Grandfather    Cancer Paternal Grandfather        liver   Hypertension Maternal Aunt    Hypertension Maternal Uncle        x3   Diabetes Maternal Uncle        x3   Breast cancer Paternal Aunt    Diabetes Paternal Aunt    Heart disease Paternal Uncle    Hypertension Cousin    Anesthesia problems Neg Hx    Hypotension Neg Hx    Malignant hyperthermia Neg Hx    Pseudochol deficiency Neg Hx     ALLERGIES:  is allergic to tape and yasmin [drospirenone-ethinyl estradiol].  MEDICATIONS:  Current Outpatient Medications  Medication Sig  Dispense Refill   Ascorbic Acid (VITAMIN C GUMMIES PO) Take 282 mg by mouth daily.     ferrous sulfate  325 (65 FE) MG EC tablet TAKE 1 TABLET(325 MG) BY MOUTH DAILY WITH BREAKFAST 30 tablet 6   nebivolol (BYSTOLIC) 5 MG tablet Take 5 mg by mouth daily.     No current facility-administered medications for this visit.    REVIEW OF SYSTEMS:   Constitutional: ( - ) fevers, ( - )  chills , ( - ) night sweats Eyes: ( - ) blurriness of vision, ( - ) double vision, ( - ) watery eyes Ears, nose, mouth, throat, and face: ( - ) mucositis, ( - )  sore throat Respiratory: ( - ) cough, ( - ) dyspnea, ( - ) wheezes Cardiovascular: ( - ) palpitation, ( - ) chest discomfort, ( - ) lower extremity swelling Gastrointestinal:  ( - ) nausea, ( - ) heartburn, ( - ) change in bowel habits Skin: ( - ) abnormal skin rashes Lymphatics: ( - ) new lymphadenopathy, ( - ) easy bruising Neurological: ( - ) numbness, ( - ) tingling, ( - ) new weaknesses Behavioral/Psych: ( - ) mood change, ( - ) new changes  All other systems were reviewed with the patient and are negative.  PHYSICAL EXAMINATION: ECOG PERFORMANCE STATUS: 0 - Asymptomatic  Vitals:   06/30/24 1538  BP: 120/78  Pulse: 60  Resp: 16  Temp: 98.2 F (36.8 C)  SpO2: 100%     Filed Weights   06/30/24 1538  Weight: 135 lb 11.2 oz (61.6 kg)      GENERAL: well appearing female in NAD  SKIN: skin color, texture, turgor are normal, no rashes or significant lesions EYES: conjunctiva are pink and non-injected, sclera clear LUNGS: clear to auscultation and percussion with normal breathing effort HEART: regular rate & rhythm and no murmurs and no lower extremity edema Musculoskeletal: no cyanosis of digits and no clubbing  PSYCH: alert & oriented x 3, fluent speech NEURO: no focal motor/sensory deficits  LABORATORY DATA:  I have reviewed the data as listed    Latest Ref Rng & Units 06/30/2024    3:21 PM 03/14/2024    4:01 PM 12/14/2023    2:44 PM   CBC  WBC 4.0 - 10.5 K/uL 4.3  4.9  4.7   Hemoglobin 12.0 - 15.0 g/dL 86.8  87.6  86.5   Hematocrit 36.0 - 46.0 % 38.6  36.2  40.1   Platelets 150 - 400 K/uL 224  207  234        Latest Ref Rng & Units 06/30/2024    3:21 PM 03/14/2024    4:01 PM 12/14/2023    2:44 PM  CMP  Glucose 70 - 99 mg/dL 79  89  74   BUN 6 - 20 mg/dL 14  16  12    Creatinine 0.44 - 1.00 mg/dL 9.25  9.26  9.24   Sodium 135 - 145 mmol/L 140  137  139   Potassium 3.5 - 5.1 mmol/L 4.3  3.6  3.7   Chloride 98 - 111 mmol/L 106  104  103   CO2 22 - 32 mmol/L 30  28  32   Calcium 8.9 - 10.3 mg/dL 9.4  9.4  9.6   Total Protein 6.5 - 8.1 g/dL 7.0  7.0  6.8   Total Bilirubin 0.0 - 1.2 mg/dL 0.5  0.5  0.6   Alkaline Phos 38 - 126 U/L 43  44  47   AST 15 - 41 U/L 12  13  12    ALT 0 - 44 U/L 14  12  15      ASSESSMENT & PLAN KAPRICE KAGE is a 43 y.o. female who presents to the hematology clinic for a follow up for leukopenia/neutropenia and iron deficiency.     # Leukopenia/Neutropenia.  --Workup from 11/25/2022 ruled out hepatitis B/C, HIV, inflammatory process and nutritional deficiencies --Labs today show decrease in counts with WBC 4.3, hemoglobin 13.1, MCV 96, platelets 224 --No recurrent infections or fevers.  --Okay to take multivitamin and/or B complex --Continue to monitor at this time.   #Iron deficiency without anemia: --Secondary to  menorrhagia.  --Received IV feraheme 510 mg x 1 dose on 03/13/2023.  --labs today show WBC 4.3, hemoglobin 13.1, MCV 96, platelets 224 --No need for additional IV iron at this time.  --Continue on ferrous sulfate  325 mg once daily with a source of vitamin C.   #Strong family history of cancer: --Patient underwent genetic testing in 2019 through gynecologist --Underwent MR breast on 03/02/2023 without any evidence of malignancy.  --Patient was evaluated by genetic counselor on 01/23/2023 and decided not to pursue updated testing. She is aware to follow up with genetics team  every 1-2 years for any recommendations on additional testing in the future.   Follow up: -- as needed, recommend routine follow-up with PCP to check her blood levels.  We are happy to see her back if she develops any new or worsening hematological abnormalities.  No orders of the defined types were placed in this encounter.   All questions were answered. The patient knows to call the clinic with any problems, questions or concerns.  I have spent a total of 30 minutes minutes of face-to-face and non-face-to-face time, preparing to see the patient, performing a medically appropriate examination, counseling and educating the patient, documenting clinical information in the electronic health record,and care coordination.   Norleen IVAR Kidney, MD Department of Hematology/Oncology [facility] at [facility] Phone: [phone] Pager: [phone] Email: [email or web address]

## 2024-07-01 LAB — FERRITIN: Ferritin: 89 ng/mL (ref 11–307)
